# Patient Record
Sex: Female | Born: 1990 | Race: White | Hispanic: No | Marital: Single | Smoking: Current some day smoker
Health system: Southern US, Community
[De-identification: ages and names within clinical notes are randomized; demographics above are authoritative.]

## PROBLEM LIST (undated history)

## (undated) DIAGNOSIS — K8020 Calculus of gallbladder without cholecystitis without obstruction: Secondary | ICD-10-CM

## (undated) HISTORY — PX: EYE SURGERY: SHX253

---

## 2012-10-18 ENCOUNTER — Encounter (HOSPITAL_COMMUNITY): Payer: Self-pay | Admitting: *Deleted

## 2012-10-18 ENCOUNTER — Emergency Department (HOSPITAL_COMMUNITY): Payer: Self-pay

## 2012-10-18 ENCOUNTER — Emergency Department (HOSPITAL_COMMUNITY)
Admission: EM | Admit: 2012-10-18 | Discharge: 2012-10-18 | Disposition: A | Discharge status: Home / Self Care | Payer: Self-pay | Attending: Emergency Medicine | Admitting: Emergency Medicine

## 2012-10-18 DIAGNOSIS — Z79899 Other long term (current) drug therapy: Secondary | ICD-10-CM | POA: Insufficient documentation

## 2012-10-18 DIAGNOSIS — N946 Dysmenorrhea, unspecified: Secondary | ICD-10-CM | POA: Insufficient documentation

## 2012-10-18 DIAGNOSIS — F17200 Nicotine dependence, unspecified, uncomplicated: Secondary | ICD-10-CM | POA: Insufficient documentation

## 2012-10-18 LAB — BASIC METABOLIC PANEL
CO2: 24 mEq/L (ref 19–32)
Calcium: 9 mg/dL (ref 8.4–10.5)
GFR calc non Af Amer: >90 mL/min (ref >90)
Potassium: 3.6 mEq/L (ref 3.5–5.1)
Sodium: 135 mEq/L (ref 135–145)

## 2012-10-18 LAB — URINALYSIS, ROUTINE W REFLEX MICROSCOPIC
Bilirubin Urine: NEGATIVE
Leukocytes, UA: NEGATIVE
Nitrite: NEGATIVE
Specific Gravity, Urine: 1.013 (ref 1.005–1.030)
Urobilinogen, UA: 0.2 mg/dL (ref 0.0–1.0)
pH: 6.5 (ref 5.0–8.0)

## 2012-10-18 LAB — CBC WITH DIFFERENTIAL/PLATELET
Basophils Absolute: 0 10*3/uL (ref 0.0–0.1)
Eosinophils Absolute: 0.2 10*3/uL (ref 0.0–0.7)
Eosinophils Relative: 2 % (ref 0–5)
Lymphocytes Relative: 24 % (ref 12–46)
Lymphs Abs: 2.5 10*3/uL (ref 0.7–4.0)
MCV: 86.5 fL (ref 78.0–100.0)
Neutrophils Relative %: 66 % (ref 43–77)
Platelets: 281 10*3/uL (ref 150–400)
RBC: 4.29 MIL/uL (ref 3.87–5.11)
RDW: 12.5 % (ref 11.5–15.5)
WBC: 10.5 10*3/uL (ref 4.0–10.5)

## 2012-10-18 LAB — WET PREP, GENITAL
Clue Cells Wet Prep HPF POC: NONE SEEN
Trich, Wet Prep: NONE SEEN
Yeast Wet Prep HPF POC: NONE SEEN

## 2012-10-18 LAB — URINE MICROSCOPIC-ADD ON

## 2012-10-18 MED ORDER — FENTANYL CITRATE 0.05 MG/ML IJ SOLN
50.0000 ug | Freq: Once | INTRAMUSCULAR | Status: AC
  Administered 2012-10-18: 50 ug via INTRAVENOUS
  Filled 2012-10-18: qty 2

## 2012-10-18 MED ORDER — HYDROMORPHONE HCL PF 1 MG/ML IJ SOLN
1.0000 mg | Freq: Once | INTRAMUSCULAR | Status: AC
  Administered 2012-10-18: 1 mg via INTRAVENOUS
  Filled 2012-10-18: qty 1

## 2012-10-18 MED ORDER — HYDROCODONE-ACETAMINOPHEN 5-500 MG PO TABS: 1.0000 | ORAL_TABLET | Freq: Four times a day (QID) | ORAL | Status: DC | PRN

## 2012-10-18 MED ORDER — ONDANSETRON HCL 4 MG PO TABS: 4.0000 mg | ORAL_TABLET | Freq: Four times a day (QID) | ORAL | Status: DC

## 2012-10-18 MED ORDER — ONDANSETRON HCL 4 MG/2ML IJ SOLN
4.0000 mg | Freq: Once | INTRAMUSCULAR | Status: AC
  Administered 2012-10-18: 4 mg via INTRAVENOUS
  Filled 2012-10-18: qty 2

## 2012-10-18 MED ORDER — SODIUM CHLORIDE 0.9 % IV BOLUS (SEPSIS)
1000.0000 mL | Freq: Once | INTRAVENOUS | Status: AC
  Administered 2012-10-18: 1000 mL via INTRAVENOUS

## 2012-10-18 NOTE — Progress Notes (Signed)
[facility] ED CM noted no pcp nor coverage CM spoke with pt and female family at bedside who states pt recently (less than 6 months) moved to [place]. Pt states she will be obtaining insurance coverage soon through her job at [employer] and discussed with pt how to get an in network pcp (to prevent extra fees for using out of network provider) Pt voiced understanding and appreciation of information provided

## 2012-10-18 NOTE — ED Provider Notes (Signed)
History     CSN: [id number]  Arrival date & time 10/18/12  1257   First MD Initiated Contact with Patient 10/18/12 1311      No chief complaint on file.   (Consider location/radiation/quality/duration/timing/severity/associated sxs/prior treatment) HPI  Pt to the ED with  Complaints of vaginal bleeding and uterine cramping. She admits that this is her second period this month and that her periods each month have been getting progressively worse. She admits that she has come to the hospital before for the same many years ago and they told her "nothing was wrong". She denies having any pre-existing disease. She is not passing any clots, she has not been vomiting or having diarrhea. Denies abnormal vaginal discharge. vss but in severe pain   History reviewed. No pertinent past medical history.  History reviewed. No pertinent past surgical history.  History reviewed. No pertinent family history.  History  Substance Use Topics  . Smoking status: Current Every Day Smoker  . Smokeless tobacco: Never Used  . Alcohol Use: Yes    OB History    Grav Para Term Preterm Abortions TAB SAB Ect Mult Living                  Review of Systems   Review of Systems  Gen: no weight loss, fevers, chills, night sweats  Eyes: no discharge or drainage, no occular pain or visual changes  Nose: no epistaxis or rhinorrhea  Mouth: no dental pain, no sore throat  Neck: no neck pain  Lungs:No wheezing, coughing or hemoptysis CV: no chest pain, palpitations, dependent edema or orthopnea  Abd: no abdominal pain, nausea, vomiting  GU: no dysuria or gross hematuria, + vaginal bleeding and suprapubic pain MSK:  No abnormalities  Neuro: no headache, no focal neurologic deficits  Skin: no abnormalities Psyche: negative.    Allergies  Review of patient's allergies indicates no known allergies.  Home Medications   Current Outpatient Rx  Name Route Sig Dispense Refill  . IBUPROFEN 200 MG PO  TABS Oral Take 200 mg by mouth every 6 (six) hours as needed. PAIN      BP 132/77  Pulse 69  Temp 97.6 F (36.4 C) (Oral)  Resp 18  SpO2 100%  LMP 10/18/2012  Physical Exam  Nursing note and vitals reviewed. Constitutional: She appears well-developed and well-nourished. No distress.  HENT:  Head: Normocephalic and atraumatic.  Eyes: Pupils are equal, round, and reactive to light.  Neck: Normal range of motion. Neck supple.  Cardiovascular: Normal rate and regular rhythm.   Pulmonary/Chest: Effort normal.  Abdominal: Soft.  Genitourinary: Uterus is tender. Uterus is not deviated, not enlarged and not fixed. Right adnexum displays tenderness. Right adnexum displays no mass and no fullness. Left adnexum displays tenderness. Left adnexum displays no mass and no fullness. There is tenderness and bleeding around the vagina. No erythema around the vagina. No foreign body around the vagina. No signs of injury around the vagina. No vaginal discharge found.  Neurological: She is alert.  Skin: Skin is warm and dry.    ED Course  Procedures (including critical care time)  Labs Reviewed  URINALYSIS, ROUTINE W REFLEX MICROSCOPIC - Abnormal; Notable for the following:    Hgb urine dipstick SMALL (*)     All other components within normal limits  WET PREP, GENITAL - Abnormal; Notable for the following:    WBC, Wet Prep HPF POC FEW (*)     All other components within normal limits  PREGNANCY, URINE  CBC WITH DIFFERENTIAL  BASIC METABOLIC PANEL  URINE MICROSCOPIC-ADD ON  GC/CHLAMYDIA PROBE AMP, GENITAL   US Transvaginal Non-ob  10/18/2012  *RADIOLOGY REPORT*  Clinical Data:  Pelvic pain.  Rule out torsion.  TRANSABDOMINAL AND TRANSVAGINAL ULTRASOUND OF PELVIS DOPPLER ULTRASOUND OF OVARIES  Technique:  Both transabdominal and transvaginal ultrasound examinations of the pelvis were performed. Transabdominal technique was performed for global imaging of the pelvis including uterus, ovaries,  adnexal regions, and pelvic cul-de-sac.  It was necessary to proceed with endovaginal exam following the transabdominal exam to visualize the ovaries.  Color and duplex Doppler ultrasound was utilized to evaluate blood flow to the ovaries.  Comparison:  None.  Findings:  Uterus:  7.64-0.9 x 4.0 cm.  Myometrium is within normal limits.  Endometrium:  Normal in thickness and appearance  Right ovary: Normal appearance/no adnexal mass  Left ovary:   Normal appearance/no adnexal mass  Pulsed Doppler evaluation demonstrates normal-appearing low resistance arterial wave form and both ovaries.  Venous wave forms are present in both ovaries.  Trace amount of free fluid is present likely physiologic.  IMPRESSION: Within normal limits.  Trace free fluid is likely physiologic.  No sonographic evidence for ovarian torsion.   Original Report Authenticated By: Donavan Burnet, M.D.    US Pelvis Complete  10/18/2012  *RADIOLOGY REPORT*  Clinical Data:  Pelvic pain.  Rule out torsion.  TRANSABDOMINAL AND TRANSVAGINAL ULTRASOUND OF PELVIS DOPPLER ULTRASOUND OF OVARIES  Technique:  Both transabdominal and transvaginal ultrasound examinations of the pelvis were performed. Transabdominal technique was performed for global imaging of the pelvis including uterus, ovaries, adnexal regions, and pelvic cul-de-sac.  It was necessary to proceed with endovaginal exam following the transabdominal exam to visualize the ovaries.  Color and duplex Doppler ultrasound was utilized to evaluate blood flow to the ovaries.  Comparison:  None.  Findings:  Uterus:  7.64-0.9 x 4.0 cm.  Myometrium is within normal limits.  Endometrium:  Normal in thickness and appearance  Right ovary: Normal appearance/no adnexal mass  Left ovary:   Normal appearance/no adnexal mass  Pulsed Doppler evaluation demonstrates normal-appearing low resistance arterial wave form and both ovaries.  Venous wave forms are present in both ovaries.  Trace amount of free fluid is  present likely physiologic.  IMPRESSION: Within normal limits.  Trace free fluid is likely physiologic.  No sonographic evidence for ovarian torsion.   Original Report Authenticated By: Donavan Burnet, M.D.    [place] Art/ven Flow Abd Pelv Doppler  10/18/2012  *RADIOLOGY REPORT*  Clinical Data:  Pelvic pain.  Rule out torsion.  TRANSABDOMINAL AND TRANSVAGINAL ULTRASOUND OF PELVIS DOPPLER ULTRASOUND OF OVARIES  Technique:  Both transabdominal and transvaginal ultrasound examinations of the pelvis were performed. Transabdominal technique was performed for global imaging of the pelvis including uterus, ovaries, adnexal regions, and pelvic cul-de-sac.  It was necessary to proceed with endovaginal exam following the transabdominal exam to visualize the ovaries.  Color and duplex Doppler ultrasound was utilized to evaluate blood flow to the ovaries.  Comparison:  None.  Findings:  Uterus:  7.64-0.9 x 4.0 cm.  Myometrium is within normal limits.  Endometrium:  Normal in thickness and appearance  Right ovary: Normal appearance/no adnexal mass  Left ovary:   Normal appearance/no adnexal mass  Pulsed Doppler evaluation demonstrates normal-appearing low resistance arterial wave form and both ovaries.  Venous wave forms are present in both ovaries.  Trace amount of free fluid is present likely physiologic.  IMPRESSION: Within  normal limits.  Trace free fluid is likely physiologic.  No sonographic evidence for ovarian torsion.   Original Report Authenticated By: Donavan Burnet, M.D.      1. Dysmenorrhea       MDM  Work-up negative for anemia or signs of infections. [place] negative for PID, ovarian torsion or any other abnormalities.  Pt has been advised of the symptoms that warrant their return to the ED. Patient has voiced understanding and has agreed to follow-up with the PCP or specialist.         Dorthula Matas, PA 10/18/12 1626

## 2012-10-18 NOTE — ED Notes (Signed)
Pt states she's having her menstrual cycle for the 2nd time this month, states started today, having heavy bleeding, denies clots, states having nausea/vomiting and dizziness with it also. Pt states having severe abdominal pain.

## 2012-10-18 NOTE — ED Notes (Signed)
Pt c/o of increased pain. MD notified.

## 2012-10-19 NOTE — ED Provider Notes (Signed)
Medical screening examination/treatment/procedure(s) were performed by non-physician practitioner and as supervising physician I was immediately available for consultation/collaboration.   Loren Racer, MD 10/19/12 [phone]

## 2013-01-01 ENCOUNTER — Emergency Department (HOSPITAL_COMMUNITY): Payer: BC Managed Care – PPO

## 2013-01-01 ENCOUNTER — Encounter (HOSPITAL_COMMUNITY): Payer: Self-pay | Admitting: *Deleted

## 2013-01-01 ENCOUNTER — Emergency Department (INDEPENDENT_AMBULATORY_CARE_PROVIDER_SITE_OTHER)
Admission: EM | Admit: 2013-01-01 | Discharge: 2013-01-01 | Disposition: A | Discharge status: Nursing Home (Includes ICF) | Payer: BC Managed Care – PPO | Source: Home / Self Care

## 2013-01-01 ENCOUNTER — Encounter (HOSPITAL_COMMUNITY): Payer: Self-pay | Admitting: Emergency Medicine

## 2013-01-01 ENCOUNTER — Emergency Department (HOSPITAL_COMMUNITY)
Admission: EM | Admit: 2013-01-01 | Discharge: 2013-01-02 | Disposition: A | Discharge status: Home / Self Care | Payer: BC Managed Care – PPO | Attending: Emergency Medicine | Admitting: Emergency Medicine

## 2013-01-01 DIAGNOSIS — R42 Dizziness and giddiness: Secondary | ICD-10-CM

## 2013-01-01 DIAGNOSIS — F17200 Nicotine dependence, unspecified, uncomplicated: Secondary | ICD-10-CM | POA: Insufficient documentation

## 2013-01-01 DIAGNOSIS — B9789 Other viral agents as the cause of diseases classified elsewhere: Secondary | ICD-10-CM | POA: Insufficient documentation

## 2013-01-01 DIAGNOSIS — H539 Unspecified visual disturbance: Secondary | ICD-10-CM

## 2013-01-01 DIAGNOSIS — H8302 Labyrinthitis, left ear: Secondary | ICD-10-CM

## 2013-01-01 DIAGNOSIS — B349 Viral infection, unspecified: Secondary | ICD-10-CM

## 2013-01-01 DIAGNOSIS — G119 Hereditary ataxia, unspecified: Secondary | ICD-10-CM

## 2013-01-01 DIAGNOSIS — H8309 Labyrinthitis, unspecified ear: Secondary | ICD-10-CM | POA: Insufficient documentation

## 2013-01-01 LAB — BASIC METABOLIC PANEL
BUN: 14 mg/dL (ref 6–23)
CO2: 29 mEq/L (ref 19–32)
Chloride: 101 mEq/L (ref 96–112)
GFR calc Af Amer: >90 mL/min (ref >90)
Glucose, Bld: 97 mg/dL (ref 70–99)
Potassium: 4.6 mEq/L (ref 3.5–5.1)

## 2013-01-01 LAB — CBC WITH DIFFERENTIAL/PLATELET
HCT: 38.6 % (ref 36.0–46.0)
Hemoglobin: 13 g/dL (ref 12.0–15.0)
Lymphocytes Relative: 28 % (ref 12–46)
Lymphs Abs: 2.9 10*3/uL (ref 0.7–4.0)
Monocytes Absolute: 0.8 10*3/uL (ref 0.1–1.0)
Monocytes Relative: 8 % (ref 3–12)
Neutro Abs: 6.2 10*3/uL (ref 1.7–7.7)
Neutrophils Relative %: 61 % (ref 43–77)
RBC: 4.4 MIL/uL (ref 3.87–5.11)

## 2013-01-01 LAB — GLUCOSE, CAPILLARY: Glucose-Capillary: 94 mg/dL (ref 70–99)

## 2013-01-01 MED ORDER — MECLIZINE HCL 25 MG PO TABS
25.0000 mg | ORAL_TABLET | Freq: Once | ORAL | Status: AC
  Administered 2013-01-02: 25 mg via ORAL
  Filled 2013-01-01: qty 1

## 2013-01-01 MED ORDER — IBUPROFEN 800 MG PO TABS
800.0000 mg | ORAL_TABLET | Freq: Once | ORAL | Status: AC
  Administered 2013-01-02: 800 mg via ORAL
  Filled 2013-01-01: qty 1

## 2013-01-01 NOTE — ED Notes (Signed)
The pts husband is furious.  He wants to eat and he wants his wife to eat.  Policy  Quoted.  Very unhappy

## 2013-01-01 NOTE — ED Provider Notes (Signed)
History     CSN: [id number]  Arrival date & time 01/01/13  1703   First MD Initiated Contact with Patient 01/01/13 2322      Chief Complaint  Patient presents with  . Neck Pain  . Blurred Vision    (Consider location/radiation/quality/duration/timing/severity/associated sxs/prior treatment) HPISuzanna Holland is a 22 y.o. female who presents with a constellation of complaints. Patient was a transfer from urgent care. Patient complains about neck pain and some what she describes as "after images." She says that when objects move in front of her face she says she sees trails and after images of that and is associated then with a sensation that the world is moving her vertigo and nausea with disorientation. She says the symptoms are severe and constant throughout the day.  On Sunday, patient went to [employer] to eat and noticed a rash on her face while in the bathroom, this resolved and there is no other symptoms. Monday, the patient was fine. Tuesday, the patient awoke with some left-sided trapezius pain it is worse when she turns her head or shortness her shoulders. This feels like a muscular or aching pain, it is moderate, she has not taken anything for it.  During the morning she developed the problems with her vision,  Nausea and vertigo.  Patient is also noted on Monday that she had some here pain, sense of fullness, and some decreased hearing in the left ear. She denies any tinnitus. No fevers or chills.  Patient denies any chest pain, shortness of breath, productive cough, abdominal pain, nausea vomiting or diarrhea, no weakness, numbness or tingling, no problems walking.   History reviewed. No pertinent past medical history.  Past Surgical History  Procedure Date  . Eye surgery     No family history on file.  History  Substance Use Topics  . Smoking status: Current Every Day Smoker -- 0.2 packs/day    Types: Cigarettes  . Smokeless tobacco: Never Used  . Alcohol Use: Yes      OB History    Grav Para Term Preterm Abortions TAB SAB Ect Mult Living                  Review of Systems At least 10pt or greater review of systems completed and are negative except where specified in the HPI.  Allergies  Other  Home Medications   Current Outpatient Rx  Name  Route  Sig  Dispense  Refill  . IBUPROFEN 200 MG PO TABS   Oral   Take 200 mg by mouth every 6 (six) hours as needed. PAIN           BP 119/62  Pulse 64  Temp 97.9 F (36.6 C) (Oral)  Resp 16  SpO2 100%  LMP 12/17/2012  Physical Exam  PHYSICAL EXAM: VITAL SIGNS:  . Filed Vitals:   01/02/13 0019 01/02/13 0021 01/02/13 0145 01/02/13 0200  BP: 105/56 117/61  110/65  Pulse: 81 84 80 82  Temp:      TempSrc:      Resp:    16  SpO2:   97% 97%   CONSTITUTIONAL: Awake, oriented, appears non-toxic HENT: Atraumatic, normocephalic, oral mucosa pink and moist, airway patent. Nares patent without drainage. External ears normal, mild fluid effusion on the left, right appears normal. EYES: Conjunctiva clear, EOMI, PERRLA NECK: Trachea midline, non-tender, supple CARDIOVASCULAR: Normal heart rate, Normal rhythm, No murmurs, rubs, gallops PULMONARY/CHEST: Clear to auscultation, no rhonchi, wheezes, or rales. Symmetrical breath sounds.  CHEST WALL: No lesions. Non-tender. ABDOMINAL: Non-distended, soft, non-tender - no rebound or guarding.  BS normal. NEUROLOGIC: AV:WUJWJX fields intact. PERRLA, EOMI.  Facial sensation equal to light touch bilaterally.  Good muscle bulk in the masseter muscle and good lateral movement of the jaw.  Facial expressions equal and good strength with smile/frown and puffed cheeks.  Hearing grossly intact to finger rub test.  Uvula, tongue are midline with no deviation. Symmetrical palate elevation.  Trapezius and SCM muscles are 5/5 strength bilaterally.   DTR: Brachioradialis, biceps, patellar, Achilles tendon reflexes 2+ bilaterally.  No clonus. Strength: 5/5 strength  flexors and extensors in the upper and lower extremities.  Grip strength, finger adduction/abduction 5/5. Sensation: Sensation intact distally to light touch Cerebellar: No ataxia with walking or dysmetria with finger to nose, rapid alternating hand movements and heels to shin testing. Patient is able to walk unassisted. EXTREMITIES: No clubbing, cyanosis, or edema SKIN: Warm, Dry, No erythema, No rash   ED Course  Procedures (including critical care time)   Labs Reviewed  CBC WITH DIFFERENTIAL  BASIC METABOLIC PANEL   Ct Head Wo Contrast  01/01/2013  *RADIOLOGY REPORT*  Clinical Data: 22 year old female with headache, blurred vision and ataxia.  CT HEAD WITHOUT CONTRAST  Technique:  Contiguous axial images were obtained from the base of the skull through the vertex without contrast.  Comparison: None  Findings: No intracranial abnormalities are identified, including mass lesion or mass effect, hydrocephalus, extra-axial fluid collection, midline shift, hemorrhage, or acute infarction.  The visualized bony calvarium is unremarkable. The visualized paranasal sinuses and mastoid air cells are clear.  IMPRESSION: Unremarkable noncontrast head CT.   Original Report Authenticated By: Harmon Pier, M.D.      1. Vertigo   2. Labyrinthitis of left ear   3. Viral syndrome       MDM  Kelli Holland is a 22 y.o. female referred to the emergency department by urgent care for concerns of "cerebellar ataxia." Patient is not ataxic, she has no cerebellar symptoms at this time. Patient does have signs and symptoms consistent with vertigo or labyrinthitis. This is a peripheral cause I do not think it is central.  Patient did have a CT in the emergency department which was negative. Also labs including a BMP and CBC are within normal limits.  Patient was given some meclizine, had some sleep, and then felt much better. Patient was able to get up and walk without an ataxic gait.  The patient's symptoms are  most likely related to peripheral vertigo secondary to viral infection of the labyrinth, indicated by the patient's temporary decreased hearing in the left ear, mild left ear effusion.  No evidence for intracranial emergency at this time or infection of the central nervous system. Vital signs are stable and within normal limits, patient is nontoxic and afebrile.  She is looking to followup with primary care provider - she can find one of her choosing in followup.  I explained the diagnosis and have given explicit precautions to return to the ER including any other new or worsening symptoms. The patient understands and accepts the medical plan as it's been dictated and I have answered their questions. Discharge instructions concerning home care and prescriptions have been given.  The patient is STABLE and is discharged to home in good condition.          Jones Skene, MD 01/02/13 [phone]

## 2013-01-01 NOTE — ED Notes (Signed)
Pt and pt husband states that on Sunday pt had allergic reaction that caused her to have dizziness, pins and needles, lose hearing in left ear, blurry vision, itching. Symptoms subsided. Pt states "Yesterday, I kicked butt at work and today my symptoms came back." Pt c/o dizzy, neck pain, and neuro deficits noted at [facility]. Pt husband states "She failed a standard DUI test and finger tracking test and she is losing tracks of time." Pt having loss of memory per pt husband.

## 2013-01-01 NOTE — ED Provider Notes (Signed)
History     CSN: [id number]  Arrival date & time 01/01/13  1444   First MD Initiated Contact with Patient 01/01/13 1601      Chief Complaint  Patient presents with  . Torticollis    (Consider location/radiation/quality/duration/timing/severity/associated sxs/prior treatment) HPI Comments: 22 year old female presents with neck pain that started this morning. She is also complaining of dizziness, "vision jumping" and nausea. She denies fever, chills or other constitutional symptoms. Denies URI symptoms, GI/GU symptoms. She speaks softly, quietly and has difficulty in explaining her symptoms.   History reviewed. No pertinent past medical history.  History reviewed. No pertinent past surgical history.  No family history on file.  History  Substance Use Topics  . Smoking status: Current Every Day Smoker -- 0.2 packs/day    Types: Cigarettes  . Smokeless tobacco: Never Used  . Alcohol Use: Yes    OB History    Grav Para Term Preterm Abortions TAB SAB Ect Mult Living                  Review of Systems  Constitutional: Positive for activity change. Negative for fever.  HENT: Positive for neck pain. Negative for congestion, sore throat, rhinorrhea, neck stiffness, postnasal drip and ear discharge.        Positive for blurred vision and double vision.  Cardiovascular: Negative.   Gastrointestinal: Positive for nausea. Negative for vomiting.  Genitourinary: Negative.   Skin: Negative.   Neurological: Positive for dizziness and light-headedness. Negative for tremors, syncope, facial asymmetry, weakness and headaches.    Allergies  Review of patient's allergies indicates no known allergies.  Home Medications   Current Outpatient Rx  Name  Route  Sig  Dispense  Refill  . HYDROCODONE-ACETAMINOPHEN 5-500 MG PO TABS   Oral   Take 1-2 tablets by mouth every 6 (six) hours as needed for pain.   15 tablet   0   . IBUPROFEN 200 MG PO TABS   Oral   Take 200 mg by mouth every  6 (six) hours as needed. PAIN         . ONDANSETRON HCL 4 MG PO TABS   Oral   Take 1 tablet (4 mg total) by mouth every 6 (six) hours.   12 tablet   0     BP 118/61  Pulse 73  Temp 98.3 F (36.8 C) (Oral)  Resp 16  SpO2 100%  LMP 12/17/2012  Physical Exam  Constitutional: She appears well-developed and well-nourished. No distress.  HENT:  Mouth/Throat: Oropharynx is clear and moist. No oropharyngeal exudate.       Bilateral TMs are normal  Eyes: Pupils are equal, round, and reactive to light.  Neck:       Limited range of motion of the neck due to pain in the left para cervical musculature. She is able to rotate her head approximately 20, limited due to pain. Right rotation is 45, forward flexion is complete.  Cardiovascular: Normal rate, regular rhythm and normal heart sounds.   Pulmonary/Chest: Effort normal and breath sounds normal. No respiratory distress. She has no wheezes.  Lymphadenopathy:    She has no cervical adenopathy.  Neurological: She is alert. A cranial nerve deficit is present.       She has difficulty in completing cranial nerve tests. Several attempts to establish EOMIs causes her to shut her eyes and stopped the test. She is unable to complete finger to nose with pass pointing and "chasing" the testing digit . Positive  Romberg. And inability to perform heel to toe without falling out of line.  Skin: Skin is warm and dry.    ED Course  Procedures (including critical care time)   Labs Reviewed  GLUCOSE, CAPILLARY   No results found.   1. Dizziness   2. Visual disturbances   3. Cerebellar ataxia       MDM   22 year old female with acute changes in neurologic activity. She is having dizziness, visual disturbances with diplopia,blurred vision and nausea. She exhibits signs of cerebellar ataxia and  Poor coordination. Transfer to the emergency department for further evaluation.        Hayden Rasmussen, NP 01/01/13 1643  Hayden Rasmussen,  NP 01/01/13 1649

## 2013-01-01 NOTE — ED Notes (Addendum)
Pt c/o painful neck and stiffness since this am Reports a poss allergic reaction to food she had on Sunday.  Sx included: disorientation, hives, left ear numbness along w/decreased hearing, SOB, chest tightness, vomiting Took Sudafed w/some relief.  Denies: fevers, diarrhea, inj/trauma to site  She is alert w/no signs of acute distress.

## 2013-01-01 NOTE — ED Notes (Signed)
Pt is transfer from urgent care.  Pt has had neck pain since this am.  Pt is complaining of issues with eyes, blurred vision.  Pt states changes started Sunday afternoon and feels like things are moving around.   Sent down here for symptoms of cerebellar ataxia.

## 2013-01-01 NOTE — ED Notes (Signed)
While tracking this rns finger, pt blinked her eyes multiple times. Pt states "Its just that i see everything on both sides and it really bothers me." Pt eyes do track this RNs finger. No ataxia noted by this rn.

## 2013-01-01 NOTE — ED Provider Notes (Signed)
Medical screening examination/treatment/procedure(s) were performed by resident physician or non-physician practitioner and as supervising physician I was immediately available for consultation/collaboration.   Harace Mccluney DOUGLAS MD.    Maigan Bittinger D Leno Mathes, MD 01/01/13 1754 

## 2013-01-02 MED ORDER — MECLIZINE HCL 25 MG PO TABS: 25.0000 mg | ORAL_TABLET | Freq: Three times a day (TID) | ORAL | Status: DC | PRN

## 2013-01-02 NOTE — ED Notes (Signed)
Pt discharged.Vital signs stable and GCS 15 

## 2013-01-02 NOTE — ED Notes (Signed)
Meals given.

## 2013-01-02 NOTE — ED Notes (Signed)
Pt ambulated to the bathroom without assistance. 

## 2013-04-12 ENCOUNTER — Encounter (HOSPITAL_COMMUNITY): Payer: Self-pay | Admitting: Emergency Medicine

## 2013-04-12 ENCOUNTER — Telehealth (HOSPITAL_COMMUNITY): Payer: Self-pay | Admitting: *Deleted

## 2013-04-12 ENCOUNTER — Emergency Department (HOSPITAL_COMMUNITY)
Admission: EM | Admit: 2013-04-12 | Discharge: 2013-04-12 | Disposition: A | Discharge status: Home / Self Care | Payer: BC Managed Care – PPO | Attending: Emergency Medicine | Admitting: Emergency Medicine

## 2013-04-12 DIAGNOSIS — M722 Plantar fascial fibromatosis: Secondary | ICD-10-CM | POA: Insufficient documentation

## 2013-04-12 MED ORDER — HYDROCODONE-ACETAMINOPHEN 5-325 MG PO TABS
2.0000 | ORAL_TABLET | Freq: Once | ORAL | Status: AC
  Administered 2013-04-12: 2 via ORAL
  Filled 2013-04-12: qty 2

## 2013-04-12 MED ORDER — IBUPROFEN 800 MG PO TABS: 800.0000 mg | ORAL_TABLET | Freq: Three times a day (TID) | ORAL | Status: DC

## 2013-04-12 NOTE — ED Provider Notes (Signed)
Medical screening examination/treatment/procedure(s) were performed by non-physician practitioner and as supervising physician I was immediately available for consultation/collaboration.   Lyanne Co, MD 04/12/13 [phone]

## 2013-04-12 NOTE — ED Provider Notes (Signed)
History     CSN: [id number]  Arrival date & time 04/12/13  0605   First MD Initiated Contact with Patient 04/12/13 [phone]      Chief Complaint  Patient presents with  . Foot Pain    (Consider location/radiation/quality/duration/timing/severity/associated sxs/prior treatment) HPI Comments: Patient presents to the ED with a chief complaint of right foot pain.  Patient states that the pain has been worsening since Monday.  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  She states that the pain is making it difficult to walk.  She states that the pain is moderate.  It does not radiate.  She denies any injuries to the foot.  Denies fever, chills, nausea or vomiting.  She has tried taking some ibuprofen, which did not help.  The history is provided by the patient. No language interpreter was used.    History reviewed. No pertinent past medical history.  Past Surgical History  Procedure Laterality Date  . Eye surgery      Family History  Problem Relation Age of Onset  . Hypertension Father     History  Substance Use Topics  . Smoking status: Former Smoker -- 0.25 packs/day    Types: Cigarettes  . Smokeless tobacco: Never Used  . Alcohol Use: Yes     Comment: rare    OB History   Grav Para Term Preterm Abortions TAB SAB Ect Mult Living                  Review of Systems  All other systems reviewed and are negative.    Allergies  Other  Home Medications   Current Outpatient Rx  Name  Route  Sig  Dispense  Refill  . ibuprofen (ADVIL,MOTRIN) 200 MG tablet   Oral   Take 200 mg by mouth every 6 (six) hours as needed. PAIN         . meclizine (ANTIVERT) 25 MG tablet   Oral   Take 1 tablet (25 mg total) by mouth 3 (three) times daily as needed for dizziness or nausea.   30 tablet   0     BP 116/65  Pulse 70  Temp(Src) 97.6 F (36.4 C) (Oral)  Resp 16  SpO2 100%  LMP 02/24/2013  Physical Exam  Nursing note and vitals  reviewed. Constitutional: She is oriented to person, place, and time. She appears well-developed and well-nourished.  HENT:  Head: Normocephalic and atraumatic.  Eyes: Conjunctivae and EOM are normal.  Neck: Normal range of motion.  Cardiovascular: Normal rate and intact distal pulses.   Brisk capillary refill  Pulmonary/Chest: Effort normal.  Abdominal: She exhibits no distension.  Musculoskeletal: Normal range of motion. She exhibits tenderness.  Right ankle and foot ROM and strength is 5/5, tenderness to palpation of the arch of the foot, no bony tenderness, or gross abnormality or deformity  Neurological: She is alert and oriented to person, place, and time.  Skin: Skin is dry.  No redness, increased warmth, or signs of infection  Psychiatric: She has a normal mood and affect. Her behavior is normal. Judgment and thought content normal.    ED Course  Procedures (including critical care time)  Labs Reviewed - No data to display No results found.   1. Plantar fasciitis of right foot       MDM  Patient with right foot pain.  No bony tenderness or MOI. No imaging required per [facility] rules. Symptoms and presentation are characteristic of  plantar fasciitis.   Recommend that the patient taking ibuprofen for the next week.  Orthotics for arch support.  Rest over the weekend.  Also discussed ice massage and referral to podiatry/ortho foot/ankle.  Patient understands and agrees with the plan.  She is stable and ready for discharge.        Roxy Horseman, PA-C 04/12/13 [phone]

## 2013-04-12 NOTE — ED Notes (Signed)
Pt states she is having right foot pain that started Monday night and has gotten worse since then  Pt denies injury

## 2013-05-05 ENCOUNTER — Emergency Department (HOSPITAL_COMMUNITY)
Admission: EM | Admit: 2013-05-05 | Discharge: 2013-05-05 | Disposition: A | Discharge status: Home / Self Care | Payer: BC Managed Care – PPO | Attending: Emergency Medicine | Admitting: Emergency Medicine

## 2013-05-05 ENCOUNTER — Encounter (HOSPITAL_COMMUNITY): Payer: Self-pay | Admitting: Cardiology

## 2013-05-05 DIAGNOSIS — R509 Fever, unspecified: Secondary | ICD-10-CM | POA: Insufficient documentation

## 2013-05-05 DIAGNOSIS — R112 Nausea with vomiting, unspecified: Secondary | ICD-10-CM | POA: Insufficient documentation

## 2013-05-05 DIAGNOSIS — R5381 Other malaise: Secondary | ICD-10-CM | POA: Insufficient documentation

## 2013-05-05 DIAGNOSIS — Z3202 Encounter for pregnancy test, result negative: Secondary | ICD-10-CM | POA: Insufficient documentation

## 2013-05-05 DIAGNOSIS — F17200 Nicotine dependence, unspecified, uncomplicated: Secondary | ICD-10-CM | POA: Insufficient documentation

## 2013-05-05 DIAGNOSIS — Z792 Long term (current) use of antibiotics: Secondary | ICD-10-CM | POA: Insufficient documentation

## 2013-05-05 DIAGNOSIS — R109 Unspecified abdominal pain: Secondary | ICD-10-CM | POA: Insufficient documentation

## 2013-05-05 DIAGNOSIS — R197 Diarrhea, unspecified: Secondary | ICD-10-CM | POA: Insufficient documentation

## 2013-05-05 LAB — COMPREHENSIVE METABOLIC PANEL
ALT: 17 U/L (ref 0–35)
AST: 12 U/L (ref 0–37)
Albumin: 3.6 g/dL (ref 3.5–5.2)
Alkaline Phosphatase: 61 U/L (ref 39–117)
BUN: 12 mg/dL (ref 6–23)
CO2: 29 mEq/L (ref 19–32)
Calcium: 9.7 mg/dL (ref 8.4–10.5)
Chloride: 102 mEq/L (ref 96–112)
Creatinine, Ser: 0.75 mg/dL (ref 0.50–1.10)
GFR calc Af Amer: >90 mL/min (ref >90)
GFR calc non Af Amer: >90 mL/min (ref >90)
Glucose, Bld: 102 mg/dL — ABNORMAL HIGH (ref 70–99)
Potassium: 4.2 mEq/L (ref 3.5–5.1)
Sodium: 140 mEq/L (ref 135–145)
Total Bilirubin: 0.2 mg/dL — ABNORMAL LOW (ref 0.3–1.2)
Total Protein: 7.2 g/dL (ref 6.0–8.3)

## 2013-05-05 LAB — CBC WITH DIFFERENTIAL/PLATELET
Basophils Absolute: 0 10*3/uL (ref 0.0–0.1)
Basophils Relative: 0 % (ref 0–1)
Eosinophils Absolute: 0.2 10*3/uL (ref 0.0–0.7)
Eosinophils Relative: 2 % (ref 0–5)
HCT: 38.2 % (ref 36.0–46.0)
Hemoglobin: 12.9 g/dL (ref 12.0–15.0)
Lymphocytes Relative: 26 % (ref 12–46)
Lymphs Abs: 2.9 10*3/uL (ref 0.7–4.0)
MCH: 28.8 pg (ref 26.0–34.0)
MCHC: 33.8 g/dL (ref 30.0–36.0)
MCV: 85.3 fL (ref 78.0–100.0)
Monocytes Absolute: 0.7 10*3/uL (ref 0.1–1.0)
Monocytes Relative: 6 % (ref 3–12)
Neutro Abs: 7.1 10*3/uL (ref 1.7–7.7)
Neutrophils Relative %: 65 % (ref 43–77)
Platelets: 273 10*3/uL (ref 150–400)
RBC: 4.48 MIL/uL (ref 3.87–5.11)
RDW: 12.2 % (ref 11.5–15.5)
WBC: 10.9 10*3/uL — ABNORMAL HIGH (ref 4.0–10.5)

## 2013-05-05 LAB — URINALYSIS, ROUTINE W REFLEX MICROSCOPIC
Bilirubin Urine: NEGATIVE
Glucose, UA: NEGATIVE mg/dL
Hgb urine dipstick: NEGATIVE
Ketones, ur: NEGATIVE mg/dL
Leukocytes, UA: NEGATIVE
Nitrite: NEGATIVE
Protein, ur: NEGATIVE mg/dL
Specific Gravity, Urine: 1.007 (ref 1.005–1.030)
Urobilinogen, UA: 0.2 mg/dL (ref 0.0–1.0)
pH: 6 (ref 5.0–8.0)

## 2013-05-05 LAB — LIPASE, BLOOD: Lipase: 47 U/L (ref 11–59)

## 2013-05-05 LAB — POCT PREGNANCY, URINE: Preg Test, Ur: NEGATIVE

## 2013-05-05 LAB — WET PREP, GENITAL: Yeast Wet Prep HPF POC: NONE SEEN

## 2013-05-05 MED ORDER — KETOROLAC TROMETHAMINE 15 MG/ML IJ SOLN
15.0000 mg | Freq: Once | INTRAMUSCULAR | Status: AC
  Administered 2013-05-05: 15 mg via INTRAVENOUS
  Filled 2013-05-05: qty 1

## 2013-05-05 MED ORDER — SODIUM CHLORIDE 0.9 % IV BOLUS (SEPSIS)
1000.0000 mL | Freq: Once | INTRAVENOUS | Status: AC
  Administered 2013-05-05: 1000 mL via INTRAVENOUS

## 2013-05-05 MED ORDER — ONDANSETRON HCL 4 MG/2ML IJ SOLN
4.0000 mg | Freq: Once | INTRAMUSCULAR | Status: AC
  Administered 2013-05-05: 4 mg via INTRAVENOUS
  Filled 2013-05-05: qty 2

## 2013-05-05 NOTE — ED Notes (Signed)
Pelvic cart at bedside. 

## 2013-05-05 NOTE — ED Provider Notes (Signed)
History     CSN: [id number]  Arrival date & time 05/05/13  1759   First MD Initiated Contact with Patient 05/05/13 1826      Chief Complaint  Patient presents with  . Abdominal Pain  . Fatigue     Patient is a 22 y.o. female presenting with abdominal pain.  Abdominal Pain Pain location:  LLQ, RLQ and suprapubic Pain quality: aching   Pain radiates to:  Does not radiate Pain severity:  Moderate Onset quality:  Gradual Duration:  5 days Timing:  Intermittent Progression:  Unchanged Chronicity:  New Context: not previous surgeries and not trauma   Relieved by:  Nothing Exacerbated by: nothing. Ineffective treatments:  NSAIDs Associated symptoms: diarrhea, fever and nausea   Associated symptoms: no anorexia, no chest pain, no chills, no constipation, no cough, no dysuria, no hematuria, no shortness of breath, no sore throat, no vaginal bleeding, no vaginal discharge and no vomiting   Diarrhea:    Quality:  Semi-solid   Number of occurrences:  About 5 times per day for several days; however, diarrhea has improved with immodium, and she has had no further diarrhea today   Severity:  Moderate   Duration:  5 days Fever:    Timing:  Intermittent (about 3 days)   Max temp PTA (F):  102   Progression since onset: her last fever was early this morning, several hours ago. Risk factors: no alcohol abuse, has not had multiple surgeries, not pregnant and no recent hospitalization   She has also been taking amoxicillin for presumed sinusitis throughout her course of illness; however, the diarrhea started before she started taking the antibiotic and has improved rather than worsened in the interim.  History reviewed. No pertinent past medical history.  Past Surgical History  Procedure Laterality Date  . Eye surgery      Family History  Problem Relation Age of Onset  . Hypertension Father     History  Substance Use Topics  . Smoking status: Current Some Day Smoker -- 0.25  packs/day    Types: Cigarettes  . Smokeless tobacco: Never Used  . Alcohol Use: Yes     Comment: rare    OB History   Grav Para Term Preterm Abortions TAB SAB Ect Mult Living                  Review of Systems  Constitutional: Positive for fever. Negative for chills and diaphoresis.  HENT: Negative for congestion, sore throat, rhinorrhea, neck pain and neck stiffness.   Respiratory: Negative for cough, shortness of breath and wheezing.   Cardiovascular: Negative for chest pain and leg swelling.  Gastrointestinal: Positive for nausea, abdominal pain and diarrhea. Negative for vomiting, constipation and anorexia.  Genitourinary: Negative for dysuria, urgency, frequency, hematuria, flank pain, vaginal bleeding, vaginal discharge and difficulty urinating.  Skin: Negative for rash.  Neurological: Negative for weakness, numbness and headaches.  All other systems reviewed and are negative.    Allergies  Other  Home Medications   Current Outpatient Rx  Name  Route  Sig  Dispense  Refill  . amoxicillin (AMOXIL) 875 MG tablet   Oral   Take 875 mg by mouth 2 (two) times daily. Started taking medication on Thursday 05-02-13 for 10 days         . ibuprofen (ADVIL,MOTRIN) 200 MG tablet   Oral   Take 800 mg by mouth every 8 (eight) hours as needed for pain or headache. PAIN         .  Norgestimate-Ethinyl Estradiol Triphasic (ORTHO TRI-CYCLEN, 28,) 0.18/0.215/0.25 MG-35 MCG tablet   Oral   Take 1 tablet by mouth every evening.           BP 116/68  Pulse 67  Temp(Src) 98 F (36.7 C) (Oral)  Resp 18  SpO2 99%  LMP 02/24/2013  Physical Exam  Nursing note and vitals reviewed. Constitutional: She is oriented to person, place, and time. She appears well-developed and well-nourished. No distress.  HENT:  Head: Normocephalic and atraumatic.  Mouth/Throat: Oropharynx is clear and moist.  Eyes: Conjunctivae and EOM are normal. Pupils are equal, round, and reactive to light. No  scleral icterus.  Neck: Normal range of motion. Neck supple. No JVD present.  Cardiovascular: Normal rate, regular rhythm, normal heart sounds and intact distal pulses.  Exam reveals no gallop and no friction rub.   No murmur heard. Pulmonary/Chest: Effort normal and breath sounds normal. No respiratory distress. She has no wheezes. She has no rales.  Abdominal: Soft. Normal appearance and bowel sounds are normal. She exhibits no distension and no mass. There is no hepatosplenomegaly. There is tenderness in the right lower quadrant and left lower quadrant. There is no rigidity, no rebound, no guarding, no CVA tenderness, no tenderness at McBurney's point and negative Murphy's sign. No hernia.  Genitourinary: Vagina normal. There is no lesion on the right labia. There is no lesion on the left labia. Cervix exhibits no motion tenderness, no discharge and no friability. Right adnexum displays no mass, no tenderness and no fullness. Left adnexum displays no mass, no tenderness and no fullness. No erythema, tenderness or bleeding around the vagina. No vaginal discharge found.  Musculoskeletal: She exhibits no edema.  Neurological: She is alert and oriented to person, place, and time. No cranial nerve deficit. She exhibits normal muscle tone. Coordination normal.  Skin: Skin is warm and dry. She is not diaphoretic.    ED Course  Procedures (including critical care time)  Labs Reviewed  WET PREP, GENITAL - Abnormal; Notable for the following:    WBC, Wet Prep HPF POC MODERATE (*)    All other components within normal limits  CBC WITH DIFFERENTIAL - Abnormal; Notable for the following:    WBC 10.9 (*)    All other components within normal limits  COMPREHENSIVE METABOLIC PANEL - Abnormal; Notable for the following:    Glucose, Bld 102 (*)    Total Bilirubin 0.2 (*)    All other components within normal limits  GC/CHLAMYDIA PROBE AMP  LIPASE, BLOOD  URINALYSIS, ROUTINE W REFLEX MICROSCOPIC  POCT  PREGNANCY, URINE   No results found.   1. Abdominal pain   2. Diarrhea   3. Nausea and vomiting       MDM  22 yo F, being treated for sinusitis, also with diarrhea, cramping lower abdominal pain, and fever.  Symptoms started about 1 week ago.  Diarrhea started before antibiotics.  Fever for 2-3 days.  No dysuria, VB, vaginal discharge, flank pain, or focal abdominal pain. On exam, she is afebrile, well appearing, in NAD, HENT exam unremarkable, neck supple with no meningismus, chest CTAB, abd soft, ND, normal bowel sounds, with minimal TTP throughout lower abdomen, L and R equally.  No peritonitis.  Unremarkable GU exam.  Labs reveal mild leukocytosis, normal CMP and lipase, normal urine studies.  Pain well controlled here with dose of toradol.  Likely gastroenteritis given diarrhea and febrile illness with abd cramps.  I doubt appendicitis, obstruction, volvulus, or other surgical GI  emergency.  Doubt ovarian torsion.  Doubt kidney stone.  Discussed with patient that it is not possible to r/o surgical process such as appendicitis without CT scan, but that given my very low suspicion, I do not feel the benefits of CT outweigh the risks at this time.  However, we discussed the need to return immediately for worsening pain, intractable nausea/vomiting, inability to tolerate PO intake, or any other worrisome symptoms.  Discussed expected course.  She is to followup with her PCP in 1-2 days for reevaluation, return here if needed as discussed.          Toney Sang, MD 05/06/13 [phone]

## 2013-05-05 NOTE — ED Notes (Signed)
Pt reports since Tuesday she has been having generalized fatigue and fever. States that she went to [place] and dx with stomach virus. States that she developed lower abd pain, but denies any urinary symptoms. Reports that she fevers has been up and down with no real relief.

## 2013-05-06 LAB — GC/CHLAMYDIA PROBE AMP
CT Probe RNA: NEGATIVE
GC Probe RNA: NEGATIVE

## 2013-05-08 NOTE — ED Provider Notes (Signed)
I saw and evaluated the patient, reviewed the resident's note and I agree with the findings and plan.  22yF with abdominal pain and n/v. Suspect viral illness. Mild lower abdominal tenderness w/o peritonitis. Low suspicion for emergent process. Plan symptomatic tx.   Raeford Razor, MD 05/08/13 [phone]

## 2013-05-10 ENCOUNTER — Emergency Department (HOSPITAL_COMMUNITY): Payer: BC Managed Care – PPO

## 2013-05-10 ENCOUNTER — Emergency Department (HOSPITAL_COMMUNITY)
Admission: EM | Admit: 2013-05-10 | Discharge: 2013-05-10 | Disposition: A | Discharge status: Home / Self Care | Payer: BC Managed Care – PPO | Attending: Emergency Medicine | Admitting: Emergency Medicine

## 2013-05-10 ENCOUNTER — Encounter (HOSPITAL_COMMUNITY): Payer: Self-pay | Admitting: *Deleted

## 2013-05-10 DIAGNOSIS — R109 Unspecified abdominal pain: Secondary | ICD-10-CM | POA: Insufficient documentation

## 2013-05-10 DIAGNOSIS — R110 Nausea: Secondary | ICD-10-CM | POA: Insufficient documentation

## 2013-05-10 DIAGNOSIS — R300 Dysuria: Secondary | ICD-10-CM | POA: Insufficient documentation

## 2013-05-10 DIAGNOSIS — R509 Fever, unspecified: Secondary | ICD-10-CM | POA: Insufficient documentation

## 2013-05-10 DIAGNOSIS — Z79899 Other long term (current) drug therapy: Secondary | ICD-10-CM | POA: Insufficient documentation

## 2013-05-10 DIAGNOSIS — F17200 Nicotine dependence, unspecified, uncomplicated: Secondary | ICD-10-CM | POA: Insufficient documentation

## 2013-05-10 DIAGNOSIS — Z3202 Encounter for pregnancy test, result negative: Secondary | ICD-10-CM | POA: Insufficient documentation

## 2013-05-10 LAB — CBC WITH DIFFERENTIAL/PLATELET
Basophils Absolute: 0 10*3/uL (ref 0.0–0.1)
Basophils Relative: 0 % (ref 0–1)
Eosinophils Absolute: 0.2 10*3/uL (ref 0.0–0.7)
Eosinophils Relative: 2 % (ref 0–5)
HCT: 36.4 % (ref 36.0–46.0)
Hemoglobin: 12.6 g/dL (ref 12.0–15.0)
MCH: 29.3 pg (ref 26.0–34.0)
MCHC: 34.6 g/dL (ref 30.0–36.0)
Monocytes Absolute: 0.8 10*3/uL (ref 0.1–1.0)
Monocytes Relative: 7 % (ref 3–12)
Neutro Abs: 6.7 10*3/uL (ref 1.7–7.7)
RDW: 12.2 % (ref 11.5–15.5)

## 2013-05-10 LAB — COMPREHENSIVE METABOLIC PANEL
AST: 19 U/L (ref 0–37)
Albumin: 3.5 g/dL (ref 3.5–5.2)
BUN: 9 mg/dL (ref 6–23)
Calcium: 9.3 mg/dL (ref 8.4–10.5)
Chloride: 101 mEq/L (ref 96–112)
Creatinine, Ser: 0.61 mg/dL (ref 0.50–1.10)
Total Bilirubin: 0.3 mg/dL (ref 0.3–1.2)
Total Protein: 7.2 g/dL (ref 6.0–8.3)

## 2013-05-10 LAB — URINALYSIS, ROUTINE W REFLEX MICROSCOPIC
Glucose, UA: NEGATIVE mg/dL
Hgb urine dipstick: NEGATIVE
Leukocytes, UA: NEGATIVE
Protein, ur: NEGATIVE mg/dL
Specific Gravity, Urine: 1.009 (ref 1.005–1.030)
pH: 6 (ref 5.0–8.0)

## 2013-05-10 LAB — WET PREP, GENITAL: Trich, Wet Prep: NONE SEEN

## 2013-05-10 MED ORDER — IOHEXOL 300 MG/ML  SOLN
50.0000 mL | Freq: Once | INTRAMUSCULAR | Status: AC | PRN
  Administered 2013-05-10: 50 mL via ORAL

## 2013-05-10 MED ORDER — MORPHINE SULFATE 4 MG/ML IJ SOLN
4.0000 mg | Freq: Once | INTRAMUSCULAR | Status: AC
  Administered 2013-05-10: 4 mg via INTRAVENOUS
  Filled 2013-05-10: qty 1

## 2013-05-10 MED ORDER — SODIUM CHLORIDE 0.9 % IV BOLUS (SEPSIS)
1000.0000 mL | Freq: Once | INTRAVENOUS | Status: AC
  Administered 2013-05-10: 1000 mL via INTRAVENOUS

## 2013-05-10 MED ORDER — ONDANSETRON HCL 4 MG/2ML IJ SOLN
4.0000 mg | Freq: Once | INTRAMUSCULAR | Status: AC
  Administered 2013-05-10: 4 mg via INTRAVENOUS
  Filled 2013-05-10: qty 2

## 2013-05-10 MED ORDER — IOHEXOL 300 MG/ML  SOLN
100.0000 mL | Freq: Once | INTRAMUSCULAR | Status: AC | PRN
  Administered 2013-05-10: 100 mL via INTRAVENOUS

## 2013-05-10 MED ORDER — OXYCODONE-ACETAMINOPHEN 5-325 MG PO TABS: 1.0000 | ORAL_TABLET | ORAL | Status: DC | PRN

## 2013-05-10 MED ORDER — DICYCLOMINE HCL 10 MG PO CAPS
10.0000 mg | ORAL_CAPSULE | Freq: Once | ORAL | Status: AC
  Administered 2013-05-10: 10 mg via ORAL
  Filled 2013-05-10: qty 1

## 2013-05-10 MED ORDER — OXYCODONE-ACETAMINOPHEN 5-325 MG PO TABS
1.0000 | ORAL_TABLET | Freq: Once | ORAL | Status: AC
  Administered 2013-05-10: 1 via ORAL
  Filled 2013-05-10: qty 1

## 2013-05-10 NOTE — ED Notes (Signed)
Pt presents to ed with c/o lower abdominal pain; sts was seen at [facility] ED on Sunday and was told to come back if pain gets worse. Pt also sts she is constipated and also reports burning with urination.

## 2013-05-10 NOTE — ED Provider Notes (Signed)
Complains of low abdominal pain for approximately 2 weeks. Pain is worse with eating. Last bowel movement yesterday, diarrhea. No blood per rectum. Maximum temperature 100.4. No vomiting. Admits to diminished appetite on exam alert nontoxic abdomen obese tender at lower quadrants bilaterally no guarding rigidity or rebound  Doug Sou, MD 05/10/13 2102

## 2013-05-10 NOTE — ED Provider Notes (Signed)
History     CSN: [id number]  Arrival date & time 05/10/13  4098   First MD Initiated Contact with Patient 05/10/13 1828      No chief complaint on file.   (Consider location/radiation/quality/duration/timing/severity/associated sxs/prior treatment) HPI Comments: Patient presents with a chief complaint of lower abdominal pain.  She reports that the pain has been present almost 2 weeks and is gradually worsening.  She describes the pain as a "sharp, burning" pain.   Pain located across her entire lower abdomen.  She has not taken anything for the pain prior to arrival.   She reports that the pain has been associated with some diarrhea and nausea, but no vomiting.  No blood in her stool.  She reports that her last episode of diarrhea was yesterday.  She also reports that she did have a fever earlier in the week, but no fever today.  She reports a temperature of 100.4 yesterday.  She is also describing some dysuria, but no increase in urinary frequency or urgency.  She denies vaginal discharge.  LMP was approximately 2 weeks ago.    The history is provided by the patient.    History reviewed. No pertinent past medical history.  Past Surgical History  Procedure Laterality Date  . Eye surgery      Family History  Problem Relation Age of Onset  . Hypertension Father     History  Substance Use Topics  . Smoking status: Current Some Day Smoker -- 0.25 packs/day    Types: Cigarettes  . Smokeless tobacco: Never Used  . Alcohol Use: Yes     Comment: rare    OB History   Grav Para Term Preterm Abortions TAB SAB Ect Mult Living                  Review of Systems  Constitutional: Positive for fever and chills.  Gastrointestinal: Positive for nausea, abdominal pain and diarrhea. Negative for vomiting, constipation, blood in stool and abdominal distention.  Genitourinary: Positive for dysuria.  All other systems reviewed and are negative.    Allergies  Other  Home Medications    Current Outpatient Rx  Name  Route  Sig  Dispense  Refill  . amoxicillin (AMOXIL) 875 MG tablet   Oral   Take 875 mg by mouth 2 (two) times daily. Started taking medication on Thursday 05-02-13 for 10 days         . ibuprofen (ADVIL,MOTRIN) 200 MG tablet   Oral   Take 800 mg by mouth every 8 (eight) hours as needed for pain or headache. PAIN         . Norgestimate-Ethinyl Estradiol Triphasic (ORTHO TRI-CYCLEN, 28,) 0.18/0.215/0.25 MG-35 MCG tablet   Oral   Take 1 tablet by mouth every evening.           BP 130/67  Pulse 86  Temp(Src) 97.4 F (36.3 C) (Oral)  Resp 16  SpO2 100%  LMP 02/24/2013  Physical Exam  Nursing note and vitals reviewed. Constitutional: She appears well-developed and well-nourished. No distress.  HENT:  Head: Normocephalic and atraumatic.  Mouth/Throat: Oropharynx is clear and moist.  Neck: Normal range of motion. Neck supple.  Cardiovascular: Normal rate, regular rhythm and normal heart sounds.   Pulmonary/Chest: Effort normal and breath sounds normal.  Abdominal: Soft. Normal appearance and bowel sounds are normal. She exhibits no distension and no mass. There is generalized tenderness. There is no rigidity, no rebound, no guarding, no tenderness at McBurney's point and  negative Murphy's sign.  Obese  Genitourinary: Cervix exhibits no motion tenderness. Right adnexum displays tenderness. Right adnexum displays no mass and no fullness. Left adnexum displays tenderness. Left adnexum displays no mass and no fullness.  Neurological: She is alert.  Skin: Skin is warm and dry. She is not diaphoretic.  Psychiatric: She has a normal mood and affect.    ED Course  Procedures (including critical care time)  Labs Reviewed  GC/CHLAMYDIA PROBE AMP  WET PREP, GENITAL  CBC WITH DIFFERENTIAL  COMPREHENSIVE METABOLIC PANEL  URINALYSIS, ROUTINE W REFLEX MICROSCOPIC   No results found.   No diagnosis found.  8:07 PM Patient signed out to [employer], PA-C.  Pelvic ultrasound pending.  MDM  Patient presenting with lower abdominal pain that has been present for the past 2 weeks.  Labs unremarkable.  Patient with adnexal tenderness bilaterally on exam.  Pelvic ultrasound pending.  Katie Schinlever, PA-C will follow up on results.          Pascal Lux [place], PA-C 05/11/13 2350

## 2013-05-10 NOTE — ED Provider Notes (Signed)
Pt received from [place], [place].  Healthy 22yo F presented to ED w/ c/o diffuse lower abd pain x 2 weeks, aggravated by eating.  Has been unable to eat/drink recently and is consequently generally weak.  Transvaginal US as well as CT abd/pelvis are unremarkable.  Results discussed w/ pt and her boyfriend.  Pt reports that she is in severe pain constantly.  On re-examination, afebrile, uncomfortable appearing, abd soft/non-distended, diffuse lower abd ttp.  Pt to receive one percocet in ED and d/c'd home w/ same.  Referred to GI and advised to f/u asap.  Return precautions discussed. 11:07 PM   Otilio Miu, PA-C 05/10/13 [phone]

## 2013-05-11 LAB — GC/CHLAMYDIA PROBE AMP
CT Probe RNA: NEGATIVE
GC Probe RNA: NEGATIVE

## 2013-05-11 NOTE — ED Provider Notes (Signed)
Medical screening examination/treatment/procedure(s) were conducted as a shared visit with non-physician practitioner(s) and myself.  I personally evaluated the patient during the encounter  Doug Sou, MD 05/11/13 2352

## 2013-05-11 NOTE — ED Provider Notes (Signed)
Medical screening examination/treatment/procedure(s) were conducted as a shared visit with non-physician practitioner(s) and myself.  I personally evaluated the patient during the encounter  Doug Sou, MD 05/11/13 [phone]

## 2013-07-04 ENCOUNTER — Encounter (HOSPITAL_COMMUNITY): Payer: Self-pay

## 2013-07-04 ENCOUNTER — Emergency Department (HOSPITAL_COMMUNITY)
Admission: EM | Admit: 2013-07-04 | Discharge: 2013-07-04 | Disposition: A | Discharge status: Home / Self Care | Payer: BC Managed Care – PPO | Attending: Emergency Medicine | Admitting: Emergency Medicine

## 2013-07-04 DIAGNOSIS — Z79899 Other long term (current) drug therapy: Secondary | ICD-10-CM | POA: Insufficient documentation

## 2013-07-04 DIAGNOSIS — R220 Localized swelling, mass and lump, head: Secondary | ICD-10-CM | POA: Insufficient documentation

## 2013-07-04 DIAGNOSIS — F17200 Nicotine dependence, unspecified, uncomplicated: Secondary | ICD-10-CM | POA: Insufficient documentation

## 2013-07-04 DIAGNOSIS — T498X5A Adverse effect of other topical agents, initial encounter: Secondary | ICD-10-CM | POA: Insufficient documentation

## 2013-07-04 DIAGNOSIS — R110 Nausea: Secondary | ICD-10-CM | POA: Insufficient documentation

## 2013-07-04 DIAGNOSIS — T7840XA Allergy, unspecified, initial encounter: Secondary | ICD-10-CM

## 2013-07-04 DIAGNOSIS — R51 Headache: Secondary | ICD-10-CM | POA: Insufficient documentation

## 2013-07-04 DIAGNOSIS — R221 Localized swelling, mass and lump, neck: Secondary | ICD-10-CM | POA: Insufficient documentation

## 2013-07-04 LAB — CBC
HCT: 40.9 % (ref 36.0–46.0)
Hemoglobin: 13.6 g/dL (ref 12.0–15.0)
RBC: 4.76 MIL/uL (ref 3.87–5.11)
WBC: 11.9 10*3/uL — ABNORMAL HIGH (ref 4.0–10.5)

## 2013-07-04 LAB — BASIC METABOLIC PANEL
CO2: 25 mEq/L (ref 19–32)
Chloride: 102 mEq/L (ref 96–112)
GFR calc non Af Amer: >90 mL/min (ref >90)
Glucose, Bld: 112 mg/dL — ABNORMAL HIGH (ref 70–99)
Potassium: 3.8 mEq/L (ref 3.5–5.1)
Sodium: 139 mEq/L (ref 135–145)

## 2013-07-04 MED ORDER — PREDNISONE 20 MG PO TABS: 40.0000 mg | ORAL_TABLET | Freq: Every day | ORAL | Status: DC

## 2013-07-04 MED ORDER — ONDANSETRON HCL 4 MG/2ML IJ SOLN
4.0000 mg | Freq: Four times a day (QID) | INTRAMUSCULAR | Status: DC | PRN
  Administered 2013-07-04: 4 mg via INTRAVENOUS
  Filled 2013-07-04: qty 2

## 2013-07-04 MED ORDER — FAMOTIDINE IN NACL 20-0.9 MG/50ML-% IV SOLN
20.0000 mg | Freq: Two times a day (BID) | INTRAVENOUS | Status: DC
  Filled 2013-07-04: qty 50

## 2013-07-04 MED ORDER — PREDNISONE 20 MG PO TABS
40.0000 mg | ORAL_TABLET | Freq: Once | ORAL | Status: AC
  Administered 2013-07-04: 40 mg via ORAL
  Filled 2013-07-04: qty 2

## 2013-07-04 MED ORDER — DIPHENHYDRAMINE HCL 50 MG/ML IJ SOLN
12.5000 mg | Freq: Once | INTRAMUSCULAR | Status: AC
  Administered 2013-07-04: 12.5 mg via INTRAVENOUS
  Filled 2013-07-04: qty 1

## 2013-07-04 NOTE — ED Notes (Signed)
Pt states she now has a headache and some nausea. PA aware. Family at bedside.

## 2013-07-04 NOTE — ED Notes (Signed)
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

## 2013-07-04 NOTE — ED Provider Notes (Signed)
History    CSN: [id number] Arrival date & time 07/04/13  1924  First MD Initiated Contact with Patient 07/04/13 2003     Chief Complaint  Patient presents with  . Allergic Reaction   (Consider location/radiation/quality/duration/timing/severity/associated sxs/prior Treatment) HPI Comments: Patient presents to the emergency department with chief complaint of allergic reaction. She states that she is allergic to horse rash sauce, and that she might have eaten some for dinner tonight. She states that she has become "spacey." She also states that she has had some tongue swelling. She states that she feels very tired. She denies any other symptoms at this time. She denies difficulty breathing. She has not taken anything to alleviate her symptoms. Her symptoms have not worsened while in the emergency department.  The history is provided by the patient. No language interpreter was used.   History reviewed. No pertinent past medical history. Past Surgical History  Procedure Laterality Date  . Eye surgery     Family History  Problem Relation Age of Onset  . Hypertension Father    History  Substance Use Topics  . Smoking status: Current Some Day Smoker -- 0.25 packs/day    Types: Cigarettes  . Smokeless tobacco: Never Used  . Alcohol Use: Yes     Comment: rare   OB History   Grav Para Term Preterm Abortions TAB SAB Ect Mult Living                 Review of Systems  All other systems reviewed and are negative.    Allergies  Other  Home Medications   Current Outpatient Rx  Name  Route  Sig  Dispense  Refill  . ibuprofen (ADVIL,MOTRIN) 200 MG tablet   Oral   Take 200 mg by mouth every 6 (six) hours as needed for pain.         . Norgestimate-Ethinyl Estradiol Triphasic (ORTHO TRI-CYCLEN, 28,) 0.18/0.215/0.25 MG-35 MCG tablet   Oral   Take 1 tablet by mouth every evening.         Marland Kitchen acetaminophen (TYLENOL) 500 MG tablet   Oral   Take 500 mg by mouth every 6 (six)  hours as needed for pain.         Marland Kitchen amoxicillin (AMOXIL) 875 MG tablet   Oral   Take 875 mg by mouth 2 (two) times daily. Started taking medication on Thursday 05-02-13 for 10 days         . ibuprofen (ADVIL,MOTRIN) 200 MG tablet   Oral   Take 800 mg by mouth every 8 (eight) hours as needed for pain or headache. PAIN          BP 142/87  Pulse 97  Temp(Src) 98.5 F (36.9 C) (Oral)  Resp 20  Ht 5\' 3"  (1.6 m)  Wt 240 lb (108.863 kg)  BMI 42.52 kg/m2  SpO2 98%  LMP 07/02/2013 Physical Exam  Nursing note and vitals reviewed. Constitutional: She is oriented to person, place, and time. She appears well-developed and well-nourished.  HENT:  Head: Normocephalic and atraumatic.  No tongue swelling, no signs of Ludwig angina, airway is intact  Eyes: Conjunctivae and EOM are normal. Pupils are equal, round, and reactive to light.  Neck: Normal range of motion. Neck supple.  Cardiovascular: Normal rate and regular rhythm.  Exam reveals no gallop and no friction rub.   No murmur heard. Pulmonary/Chest: Effort normal and breath sounds normal. No respiratory distress. She has no wheezes. She has no rales. She  exhibits no tenderness.  No difficulty breathing  Abdominal: Soft. Bowel sounds are normal. She exhibits no distension and no mass. There is no tenderness. There is no rebound and no guarding.  Musculoskeletal: Normal range of motion. She exhibits no edema and no tenderness.  Neurological: She is alert and oriented to person, place, and time.  Skin: Skin is warm and dry.  Psychiatric: She has a normal mood and affect. Her behavior is normal. Judgment and thought content normal.    ED Course  Procedures (including critical care time) Labs Reviewed  CBC - Abnormal; Notable for the following:    WBC 11.9 (*)    All other components within normal limits  BASIC METABOLIC PANEL   Results for orders placed during the hospital encounter of 07/04/13  CBC      Result Value Range    WBC 11.9 (*) 4.0 - 10.5 K/uL   RBC 4.76  3.87 - 5.11 MIL/uL   Hemoglobin 13.6  12.0 - 15.0 g/dL   HCT 16.1  09.6 - 04.5 %   MCV 85.9  78.0 - 100.0 fL   MCH 28.6  26.0 - 34.0 pg   MCHC 33.3  30.0 - 36.0 g/dL   RDW 40.9  81.1 - 91.4 %   Platelets 346  150 - 400 K/uL  BASIC METABOLIC PANEL      Result Value Range   Sodium 139  135 - 145 mEq/L   Potassium 3.8  3.5 - 5.1 mEq/L   Chloride 102  96 - 112 mEq/L   CO2 25  19 - 32 mEq/L   Glucose, Bld 112 (*) 70 - 99 mg/dL   BUN 8  6 - 23 mg/dL   Creatinine, Ser 7.82  0.50 - 1.10 mg/dL   Calcium 9.8  8.4 - 95.6 mg/dL   GFR calc non Af Amer >90  >90 mL/min   GFR calc Af Amer >90  >90 mL/min   No results found.   1. Allergic reaction, initial encounter     MDM  Patient with allergic reaction, treated with Benadryl and Pepcid. Will monitor for 1 hour, and anticipate discharge.  9:43 PM Filed Vitals:   07/04/13 2138  BP: 114/58  Pulse: 67  Temp:   Resp: 16    Patient states that she has a slight headache, but states that she will take some Tylenol or ibuprofen when she gets home. She is stable and ready for discharge. She is neurovascularly intact. There is no airway obstruction. She is breathing appropriately. She is stable and ready for discharge.  Roxy Horseman, PA-C 07/04/13 2143

## 2013-07-07 NOTE — ED Provider Notes (Signed)
Medical screening examination/treatment/procedure(s) were performed by non-physician practitioner and as supervising physician I was immediately available for consultation/collaboration.   Alynah Schone L Rafferty [place], MD 07/07/13 1110 

## 2014-04-02 ENCOUNTER — Emergency Department (HOSPITAL_COMMUNITY)
Admission: EM | Admit: 2014-04-02 | Discharge: 2014-04-02 | Disposition: A | Discharge status: Home / Self Care | Payer: BC Managed Care – PPO | Attending: Emergency Medicine | Admitting: Emergency Medicine

## 2014-04-02 ENCOUNTER — Emergency Department (HOSPITAL_COMMUNITY): Payer: BC Managed Care – PPO

## 2014-04-02 ENCOUNTER — Encounter (HOSPITAL_COMMUNITY): Payer: Self-pay | Admitting: Emergency Medicine

## 2014-04-02 DIAGNOSIS — M545 Low back pain, unspecified: Secondary | ICD-10-CM | POA: Insufficient documentation

## 2014-04-02 DIAGNOSIS — E669 Obesity, unspecified: Secondary | ICD-10-CM | POA: Insufficient documentation

## 2014-04-02 DIAGNOSIS — F17200 Nicotine dependence, unspecified, uncomplicated: Secondary | ICD-10-CM | POA: Insufficient documentation

## 2014-04-02 DIAGNOSIS — R209 Unspecified disturbances of skin sensation: Secondary | ICD-10-CM | POA: Insufficient documentation

## 2014-04-02 DIAGNOSIS — R52 Pain, unspecified: Secondary | ICD-10-CM | POA: Insufficient documentation

## 2014-04-02 DIAGNOSIS — Z791 Long term (current) use of non-steroidal anti-inflammatories (NSAID): Secondary | ICD-10-CM | POA: Insufficient documentation

## 2014-04-02 DIAGNOSIS — M25559 Pain in unspecified hip: Secondary | ICD-10-CM | POA: Insufficient documentation

## 2014-04-02 DIAGNOSIS — M549 Dorsalgia, unspecified: Secondary | ICD-10-CM

## 2014-04-02 MED ORDER — CYCLOBENZAPRINE HCL 10 MG PO TABS: 10.0000 mg | ORAL_TABLET | Freq: Three times a day (TID) | ORAL | Status: DC | PRN

## 2014-04-02 MED ORDER — KETOROLAC TROMETHAMINE 60 MG/2ML IM SOLN
60.0000 mg | Freq: Once | INTRAMUSCULAR | Status: AC
  Administered 2014-04-02: 60 mg via INTRAMUSCULAR
  Filled 2014-04-02: qty 2

## 2014-04-02 MED ORDER — DIAZEPAM 5 MG/ML IJ SOLN
5.0000 mg | Freq: Once | INTRAMUSCULAR | Status: AC
  Administered 2014-04-02: 5 mg via INTRAMUSCULAR
  Filled 2014-04-02: qty 2

## 2014-04-02 MED ORDER — DEXAMETHASONE SODIUM PHOSPHATE 10 MG/ML IJ SOLN
10.0000 mg | Freq: Once | INTRAMUSCULAR | Status: AC
  Administered 2014-04-02: 10 mg via INTRAVENOUS
  Filled 2014-04-02: qty 1

## 2014-04-02 MED ORDER — NAPROXEN 500 MG PO TABS: 500.0000 mg | ORAL_TABLET | Freq: Two times a day (BID) | ORAL | Status: DC

## 2014-04-02 NOTE — ED Notes (Signed)
She c/o non-traumatic low back pain radiating into "both my hips".  She is in no distress.  She also mentions that she underwent I.U.D. Insertion yesterday.  She states she has been having this back pain for about two weeks.

## 2014-04-02 NOTE — Discharge Instructions (Signed)
Try ice and heat to your back. Both will help. Take the medications as prescribed. Recheck with your doctor if not improving or see the orthopedic back specialist. Do the back exercises once your back pain is gone to strengthen your back.

## 2014-04-02 NOTE — ED Provider Notes (Signed)
CSN: [id number]     Arrival date & time 04/02/14  5366 History   First MD Initiated Contact with Patient 04/02/14 [phone]     Chief Complaint  Patient presents with  . Back Pain     (Consider location/radiation/quality/duration/timing/severity/associated sxs/prior Treatment) HPI Patient reports she had some lower back pain earlier in March however it stopped on its own without treatment. It was gone for about 2 weeks. She states it restarted again about 9 days ago. She states she woke up with sleep with pain. Her husband states she does violent turning in her sleep where she throws herself over. She denies any known injury, falls, change in her activity. She states it started with a burning pain in her left hip but then went into her lower back and now is also into her right hip. She states any type of movement makes the pain worse. She cannot stand to wear clothing that is around her waist because of increased pain. She also states she's at the point where she is unable to walk. She has pins and needles in both of her legs. She does not have incontinence.  Patient did have an IUD placed yesterday at [facility].  PCP Dr Sherlyn Lick  History reviewed. No pertinent past medical history. Past Surgical History  Procedure Laterality Date  . Eye surgery     Family History  Problem Relation Age of Onset  . Hypertension Father    History  Substance Use Topics  . Smoking status: Current Some Day Smoker -- 0.25 packs/day    Types: Cigarettes  . Smokeless tobacco: Never Used  . Alcohol Use: Yes     Comment: rare  smokes 5 cigs a day Lives at home  Lives with spouse student  OB History   Grav Para Term Preterm Abortions TAB SAB Ect Mult Living                 Review of Systems  All other systems reviewed and are negative.     Allergies  Other  Home Medications   Current Outpatient Rx  Name  Route  Sig  Dispense  Refill  . levonorgestrel (MIRENA) 20 MCG/24HR IUD  Intrauterine   1 each by Intrauterine route once.         . cyclobenzaprine (FLEXERIL) 10 MG tablet   Oral   Take 1 tablet (10 mg total) by mouth 3 (three) times daily as needed (muscle pain).   30 tablet   0   . naproxen (NAPROSYN) 500 MG tablet   Oral   Take 1 tablet (500 mg total) by mouth 2 (two) times daily with a meal.   30 tablet   0    Placed [facility] at [facility]  BP 117/58  Pulse 90  Temp(Src) 98.7 F (37.1 C) (Oral)  Resp 22  SpO2 100%  LMP 03/22/2014  Vital signs normal   Physical Exam  Nursing note and vitals reviewed. Constitutional: She is oriented to person, place, and time. She appears well-developed and well-nourished.  Non-toxic appearance. She does not appear ill. No distress.  obese  HENT:  Head: Normocephalic and atraumatic.  Right Ear: External ear normal.  Left Ear: External ear normal.  Nose: Nose normal. No mucosal edema or rhinorrhea.  Mouth/Throat: Oropharynx is clear and moist and mucous membranes are normal. No dental abscesses or uvula swelling.  Eyes: Conjunctivae and EOM are normal. Pupils are equal, round, and reactive to light.  Neck: Normal range of motion and  full passive range of motion without pain. Neck supple.  Cardiovascular: Normal rate, regular rhythm and normal heart sounds.  Exam reveals no gallop and no friction rub.   No murmur heard. Pulmonary/Chest: Effort normal and breath sounds normal. No respiratory distress. She has no wheezes. She has no rhonchi. She has no rales. She exhibits no tenderness and no crepitus.  Abdominal: Soft. Normal appearance and bowel sounds are normal. She exhibits no distension. There is no tenderness. There is no rebound and no guarding.  Musculoskeletal: Normal range of motion. She exhibits no edema and no tenderness.       Back:  When patient flexes her right knee and hip it causes pain in her mid lower back. She has some mild tenderness over the true hip joint on the right. When  she flexes her left knee and hip it causes pain in her lower back. Her hip is nontender to palpation. On exam of her spine she only has tenderness in the lower lumbar/sacral area. She does not have SI joint tenderness to palpation. She does not have pain to palpation in the sciatic notch on either buttock.  Neurological: She is alert and oriented to person, place, and time. She has normal strength. No cranial nerve deficit.  Skin: Skin is warm, dry and intact. No rash noted. No erythema. No pallor.  Psychiatric: She has a normal mood and affect. Her speech is normal and behavior is normal. Her mood appears not anxious.    ED Course  Procedures (including critical care time)  Medications  ketorolac (TORADOL) injection 60 mg (60 mg Intramuscular Given 04/02/14 1028)  diazepam (VALIUM) injection 5 mg (5 mg Intramuscular Given 04/02/14 1029)  dexamethasone (DECADRON) injection 10 mg (10 mg Intravenous Given 04/02/14 1028)    Pain better, not gone, advised her pain would not go away that quickly.   Labs Review Labs Reviewed - No data to display Imaging Review Dg Lumbar Spine Complete  04/02/2014   CLINICAL DATA:  Low back and pelvic pain.  EXAM: LUMBAR SPINE - COMPLETE 4+ VIEW  COMPARISON:  DG PELVIS 1-2 VIEWS dated 04/02/2014; CT ABD/PELVIS W CM dated 05/10/2013  FINDINGS: Very mild dextro convex curvature of the lumbar spine may be positional. Alignment is otherwise anatomic. No pars defects. Vertebral body height is maintained. Minimal endplate degenerative change in the lower thoracic spine, incidentally imaged.  IMPRESSION: 1. No acute findings. 2. No degenerative changes in the lumbar spine. 3. Minimal degenerative disc disease incidentally imaged in the lower thoracic spine.   Electronically Signed   By: Leanna BattlesMelinda  Blietz M.D.   On: 04/02/2014 10:56   Dg Pelvis 1-2 Views  04/02/2014   CLINICAL DATA:  Back pain.  EXAM: PELVIS - 1-2 VIEW  COMPARISON:  CT ABD/PELVIS W CM dated 05/10/2013  FINDINGS: Hips  are symmetric. No joint space narrowing or degenerative change. No acute osseous abnormality. Intrauterine contraceptive device is seen in the right anatomic pelvis.  IMPRESSION: No findings to explain the patient's pain.   Electronically Signed   By: Leanna BattlesMelinda  Blietz M.D.   On: 04/02/2014 10:55     EKG Interpretation None      MDM  patient has acute low back pain without significant abnormalities on her x-ray. She has no history of back problems in the past. She may have injured herself during her sleep as her husband describe she throws herself over when she turns in the bed. Patient was given conservative treatment. She can be seen by her  PCP or orthopedics if she is not improving. She has no worrisome symptoms such as incontinence or perineal numbness.    Final diagnoses:  Back pain   New Prescriptions   CYCLOBENZAPRINE (FLEXERIL) 10 MG TABLET    Take 1 tablet (10 mg total) by mouth 3 (three) times daily as needed (muscle pain).   NAPROXEN (NAPROSYN) 500 MG TABLET    Take 1 tablet (500 mg total) by mouth 2 (two) times daily with a meal.    Plan discharge   Devoria Albe, MD, Franz Dell, MD 04/02/14 1143

## 2014-04-02 NOTE — Progress Notes (Signed)
[facility] provided pt with a list of primary care resources and a [facility] Orange Card application to help patient establish primary care.  °

## 2014-06-18 ENCOUNTER — Emergency Department (HOSPITAL_COMMUNITY)
Admission: EM | Admit: 2014-06-18 | Discharge: 2014-06-18 | Disposition: A | Discharge status: Home / Self Care | Payer: BC Managed Care – PPO | Attending: Emergency Medicine | Admitting: Emergency Medicine

## 2014-06-18 ENCOUNTER — Encounter (HOSPITAL_COMMUNITY): Payer: Self-pay | Admitting: Emergency Medicine

## 2014-06-18 DIAGNOSIS — Z79899 Other long term (current) drug therapy: Secondary | ICD-10-CM | POA: Insufficient documentation

## 2014-06-18 DIAGNOSIS — T782XXA Anaphylactic shock, unspecified, initial encounter: Secondary | ICD-10-CM

## 2014-06-18 DIAGNOSIS — T7809XA Anaphylactic reaction due to other food products, initial encounter: Secondary | ICD-10-CM | POA: Insufficient documentation

## 2014-06-18 DIAGNOSIS — Z791 Long term (current) use of non-steroidal anti-inflammatories (NSAID): Secondary | ICD-10-CM | POA: Insufficient documentation

## 2014-06-18 DIAGNOSIS — R109 Unspecified abdominal pain: Secondary | ICD-10-CM | POA: Insufficient documentation

## 2014-06-18 DIAGNOSIS — R0602 Shortness of breath: Secondary | ICD-10-CM | POA: Insufficient documentation

## 2014-06-18 DIAGNOSIS — Y9229 Other specified public building as the place of occurrence of the external cause: Secondary | ICD-10-CM | POA: Insufficient documentation

## 2014-06-18 DIAGNOSIS — T628X1A Toxic effect of other specified noxious substances eaten as food, accidental (unintentional), initial encounter: Secondary | ICD-10-CM | POA: Insufficient documentation

## 2014-06-18 DIAGNOSIS — F17200 Nicotine dependence, unspecified, uncomplicated: Secondary | ICD-10-CM | POA: Insufficient documentation

## 2014-06-18 DIAGNOSIS — F411 Generalized anxiety disorder: Secondary | ICD-10-CM | POA: Insufficient documentation

## 2014-06-18 DIAGNOSIS — Y9389 Activity, other specified: Secondary | ICD-10-CM | POA: Insufficient documentation

## 2014-06-18 DIAGNOSIS — R197 Diarrhea, unspecified: Secondary | ICD-10-CM | POA: Insufficient documentation

## 2014-06-18 LAB — URINALYSIS, ROUTINE W REFLEX MICROSCOPIC
BILIRUBIN URINE: NEGATIVE
Glucose, UA: NEGATIVE mg/dL
Hgb urine dipstick: NEGATIVE
KETONES UR: NEGATIVE mg/dL
LEUKOCYTES UA: NEGATIVE
NITRITE: NEGATIVE
PROTEIN: NEGATIVE mg/dL
Specific Gravity, Urine: 1.001 — ABNORMAL LOW (ref 1.005–1.030)
Urobilinogen, UA: 0.2 mg/dL (ref 0.0–1.0)
pH: 6.5 (ref 5.0–8.0)

## 2014-06-18 LAB — POC URINE PREG, ED: Preg Test, Ur: NEGATIVE

## 2014-06-18 MED ORDER — FAMOTIDINE IN NACL 20-0.9 MG/50ML-% IV SOLN
20.0000 mg | Freq: Once | INTRAVENOUS | Status: AC
  Administered 2014-06-18: 20 mg via INTRAVENOUS
  Filled 2014-06-18: qty 50

## 2014-06-18 MED ORDER — SODIUM CHLORIDE 0.9 % IV BOLUS (SEPSIS)
1000.0000 mL | Freq: Once | INTRAVENOUS | Status: AC
  Administered 2014-06-18: 1000 mL via INTRAVENOUS

## 2014-06-18 MED ORDER — EPINEPHRINE 0.3 MG/0.3ML IJ SOAJ: 0.3000 mg | INTRAMUSCULAR | Status: AC | PRN

## 2014-06-18 MED ORDER — METHYLPREDNISOLONE SODIUM SUCC 125 MG IJ SOLR
125.0000 mg | Freq: Once | INTRAMUSCULAR | Status: AC
  Administered 2014-06-18: 125 mg via INTRAVENOUS
  Filled 2014-06-18: qty 2

## 2014-06-18 MED ORDER — EPINEPHRINE 0.3 MG/0.3ML IJ SOAJ
0.3000 mg | Freq: Once | INTRAMUSCULAR | Status: AC
  Administered 2014-06-18: 0.3 mg via INTRAMUSCULAR
  Filled 2014-06-18: qty 0.3

## 2014-06-18 MED ORDER — DIPHENHYDRAMINE HCL 50 MG/ML IJ SOLN
25.0000 mg | Freq: Once | INTRAMUSCULAR | Status: AC
  Administered 2014-06-18: 25 mg via INTRAVENOUS
  Filled 2014-06-18: qty 1

## 2014-06-18 MED ORDER — PREDNISONE 20 MG PO TABS: 60.0000 mg | ORAL_TABLET | Freq: Every day | ORAL | Status: DC

## 2014-06-18 MED ORDER — DIPHENHYDRAMINE HCL 25 MG PO TABS: 50.0000 mg | ORAL_TABLET | ORAL | Status: AC | PRN

## 2014-06-18 NOTE — Discharge Instructions (Signed)

## 2014-06-18 NOTE — ED Notes (Addendum)
Pt c/o allergic reaction.  Sts "I feel really dry and my throat and lips feel weird.  My lips may be swollen.  I've had diarrhea twice."  Pt reports that she ate around 90 mins ago and thinks the sandwich had horseradish on the bun.  EPI Pen administered around ago.  Sts this seems like a typical allergic reaction.

## 2014-06-18 NOTE — ED Provider Notes (Signed)
CSN: [id number]     Arrival date & time 06/18/14  1739 History   First MD Initiated Contact with Patient 06/18/14 1743     Chief Complaint  Patient presents with  . Allergic Reaction     (Consider location/radiation/quality/duration/timing/severity/associated sxs/prior Treatment) HPI 23 year old female presents for concerns for an allergic reaction. 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 She had a burger at [employer] but they state only had cheeseand meat in the bun. Approximately 30 minutes after this she felt like she was getting flushed, had throat closing sensation, felt like her lips were swelling, and then had a couple loose bowel movements. Has had mild lower abdominal cramping. She states this feels like her multiple episodes of allergic reactions. The diarrhea happens sooner than it normally does but otherwise everything is like before. No rash but usually does not get a rash. She took her EpiPen about 30 minutes prior to arrival and noted it was expired. She's not feel better and feels like she's getting worse. Her significant other notes that he thinks her voice is more hoarse.  History reviewed. No pertinent past medical history. Past Surgical History  Procedure Laterality Date  . Eye surgery     Family History  Problem Relation Age of Onset  . Hypertension Father    History  Substance Use Topics  . Smoking status: Current Some Day Smoker -- 0.25 packs/day    Types: Cigarettes  . Smokeless tobacco: Never Used  . Alcohol Use: Yes     Comment: rare   OB History   Grav Para Term Preterm Abortions TAB SAB Ect Mult Living                 Review of Systems  HENT: Positive for voice change.   Respiratory: Positive for shortness of breath.   Gastrointestinal: Positive for abdominal pain and diarrhea. Negative for vomiting.  Genitourinary: Negative for dysuria.  Skin: Positive for rash.  All other systems reviewed and are negative.     Allergies   Other  Home Medications   Prior to Admission medications   Medication Sig Start Date End Date Taking? Authorizing Provider  cyclobenzaprine (FLEXERIL) 10 MG tablet Take 1 tablet (10 mg total) by mouth 3 (three) times daily as needed (muscle pain). 04/02/14   Ward GivensIva L Knapp, MD  levonorgestrel (MIRENA) 20 MCG/24HR IUD 1 each by Intrauterine route once.    Historical Provider, MD  naproxen (NAPROSYN) 500 MG tablet Take 1 tablet (500 mg total) by mouth 2 (two) times daily with a meal. 04/02/14   Ward GivensIva L Knapp, MD   There were no vitals taken for this visit. Physical Exam  Nursing note and vitals reviewed. Constitutional: She is oriented to person, place, and time. She appears well-developed and well-nourished.  HENT:  Head: Normocephalic and atraumatic.  Right Ear: External ear normal.  Left Ear: External ear normal.  Nose: Nose normal.  No lip swelling or obvious oropharyngeal edema/swelling  Eyes: Right eye exhibits no discharge. Left eye exhibits no discharge.  Cardiovascular: Normal rate, regular rhythm and normal heart sounds.   Pulmonary/Chest: Effort normal and breath sounds normal. No stridor. She has no wheezes.  Abdominal: Soft. She exhibits no distension. There is no tenderness.  Neurological: She is alert and oriented to person, place, and time.  Skin: Skin is warm and dry. No rash noted.  Psychiatric: Her mood appears anxious.    ED Course  Procedures (including critical care time) Labs Review Labs  Reviewed  URINALYSIS, ROUTINE W REFLEX MICROSCOPIC - Abnormal; Notable for the following:    Specific Gravity, Urine 1.001 (*)    All other components within normal limits  POC URINE PREG, ED    Imaging Review No results found.   EKG Interpretation None      MDM   Final diagnoses:  Anaphylaxis, initial encounter    Patient symptoms resolved after being given epi IM, solumedrol, Benadryl, and Pepcid. She was also given fluids. Patient possibly had allergic reaction  versus anaphylaxis versus other type reaction from her food. Given the she's having the throat symptoms though she was given epinephrine. Her symptoms resolved and she was watched for several hours had no recurrence. She couple borderline blood pressures in the mid 90s but was asymptomatic and her blood pressure then returned in the 100s. I feel she is stable for discharge, will DC with EpiPen prescription and steroid burst    Audree CamelScott T Goldston, MD 06/19/14 0008

## 2014-07-19 ENCOUNTER — Encounter (HOSPITAL_COMMUNITY): Payer: Self-pay | Admitting: Emergency Medicine

## 2014-07-19 ENCOUNTER — Emergency Department (HOSPITAL_COMMUNITY)
Admission: EM | Admit: 2014-07-19 | Discharge: 2014-07-19 | Disposition: A | Discharge status: Home / Self Care | Payer: BC Managed Care – PPO | Attending: Emergency Medicine | Admitting: Emergency Medicine

## 2014-07-19 ENCOUNTER — Emergency Department (HOSPITAL_COMMUNITY): Payer: BC Managed Care – PPO

## 2014-07-19 DIAGNOSIS — T781XXA Other adverse food reactions, not elsewhere classified, initial encounter: Secondary | ICD-10-CM

## 2014-07-19 DIAGNOSIS — R112 Nausea with vomiting, unspecified: Secondary | ICD-10-CM | POA: Insufficient documentation

## 2014-07-19 DIAGNOSIS — F17200 Nicotine dependence, unspecified, uncomplicated: Secondary | ICD-10-CM | POA: Insufficient documentation

## 2014-07-19 DIAGNOSIS — R6889 Other general symptoms and signs: Secondary | ICD-10-CM | POA: Insufficient documentation

## 2014-07-19 DIAGNOSIS — F411 Generalized anxiety disorder: Secondary | ICD-10-CM | POA: Insufficient documentation

## 2014-07-19 DIAGNOSIS — T628X1A Toxic effect of other specified noxious substances eaten as food, accidental (unintentional), initial encounter: Secondary | ICD-10-CM | POA: Insufficient documentation

## 2014-07-19 DIAGNOSIS — Y9289 Other specified places as the place of occurrence of the external cause: Secondary | ICD-10-CM | POA: Insufficient documentation

## 2014-07-19 DIAGNOSIS — Z79899 Other long term (current) drug therapy: Secondary | ICD-10-CM | POA: Insufficient documentation

## 2014-07-19 DIAGNOSIS — Y9389 Activity, other specified: Secondary | ICD-10-CM | POA: Insufficient documentation

## 2014-07-19 MED ORDER — DIPHENHYDRAMINE HCL 50 MG/ML IJ SOLN
25.0000 mg | Freq: Once | INTRAMUSCULAR | Status: AC
  Administered 2014-07-19: 25 mg via INTRAVENOUS
  Filled 2014-07-19: qty 1

## 2014-07-19 MED ORDER — LORAZEPAM 2 MG/ML IJ SOLN
1.0000 mg | Freq: Once | INTRAMUSCULAR | Status: AC
Start: 2014-07-19 — End: 2014-07-19
  Administered 2014-07-19: 1 mg via INTRAVENOUS
  Filled 2014-07-19: qty 1

## 2014-07-19 MED ORDER — FAMOTIDINE IN NACL 20-0.9 MG/50ML-% IV SOLN
20.0000 mg | Freq: Once | INTRAVENOUS | Status: AC
  Administered 2014-07-19: 20 mg via INTRAVENOUS
  Filled 2014-07-19: qty 50

## 2014-07-19 NOTE — ED Notes (Addendum)
Pt c/o allergic reaction after eating nuggets from [employer]. Pt states she had same reaction last month after eating [employer]. Pt states she took 1 Benadryl PTA. Pt states she "feels out of it" "can't focus"  Pt c/o throat itching and burning, dry cough noted Pt states her EPI pens are expired and is unable financially to get another one.

## 2014-07-19 NOTE — ED Provider Notes (Signed)
CSN: [id number]     Arrival date & time 07/19/14  1926 History   First MD Initiated Contact with Patient 07/19/14 1944     Chief Complaint  Patient presents with  . Allergic Reaction     (Consider location/radiation/quality/duration/timing/severity/associated sxs/prior Treatment) Patient is a 23 y.o. female presenting with allergic reaction. The history is provided by the patient.  Allergic Reaction Presenting symptoms: no difficulty swallowing and no rash   pt c/o feeling as if she is having an allergic rxn.  States shortly prior to onset symptoms, approx 1 hr ago, she ate a couple [employer].  States shortly thereafter, felt tingly/itchy sensation in throat, then noted episode nv. Emesis c/w recently ingested liquids/food, no bloody or bilious emesis. No abd pain. No diarrhea.  States was feeling well/asymptomatic all day prior. Denies other known or new food ingestion. States similar symptoms in past w horseradish/certain foods. No recent med use/new meds. No change in any other diet, and/or personal or home product. No skin rash/hives. No wheezing. No fainting. States now she feels dehydrated.     History reviewed. No pertinent past medical history. Past Surgical History  Procedure Laterality Date  . Eye surgery     Family History  Problem Relation Age of Onset  . Hypertension Father    History  Substance Use Topics  . Smoking status: Current Some Day Smoker -- 0.25 packs/day    Types: Cigarettes  . Smokeless tobacco: Never Used  . Alcohol Use: Yes     Comment: rare   OB History   Grav Para Term Preterm Abortions TAB SAB Ect Mult Living                 Review of Systems  Constitutional: Negative for fever and chills.  HENT: Negative for trouble swallowing and voice change.   Eyes: Negative for redness.  Respiratory: Negative for cough and shortness of breath.   Cardiovascular: Negative for chest pain.  Gastrointestinal: Positive for nausea and  vomiting. Negative for abdominal pain, diarrhea and abdominal distention.  Genitourinary: Negative for flank pain.  Musculoskeletal: Negative for back pain and neck pain.  Skin: Negative for rash.  Neurological: Negative for headaches.  Hematological: Does not bruise/bleed easily.  Psychiatric/Behavioral: Negative for confusion.      Allergies  Other  Home Medications   Prior to Admission medications   Medication Sig Start Date End Date Taking? Authorizing Provider  diphenhydrAMINE (BENADRYL) 25 MG tablet Take 2 tablets (50 mg total) by mouth every 4 (four) hours as needed for itching or allergies. 06/18/14  Yes Audree Camel, MD  EPINEPHrine (EPIPEN) 0.3 mg/0.3 mL IJ SOAJ injection Inject 0.3 mLs (0.3 mg total) into the muscle as needed. 06/18/14  Yes Audree Camel, MD  levonorgestrel (MIRENA) 20 MCG/24HR IUD 1 each by Intrauterine route once.   Yes Historical Provider, MD   BP 133/89  Pulse 104  Temp(Src) 98 F (36.7 C) (Oral)  Resp 22  Wt 238 lb 9.6 oz (108.228 kg)  SpO2 99% Physical Exam  Nursing note and vitals reviewed. Constitutional: She is oriented to person, place, and time. She appears well-developed and well-nourished. No distress.  Anxious appearing.   HENT:  Nose: Nose normal.  Mouth/Throat: Oropharynx is clear and moist.  Eyes: Conjunctivae are normal. No scleral icterus.  Neck: Normal range of motion. Neck supple. No tracheal deviation present. No thyromegaly present.  Cardiovascular: Normal rate, regular rhythm, normal heart sounds and intact distal pulses.  Exam reveals  no gallop and no friction rub.   No murmur heard. Pulmonary/Chest: Effort normal and breath sounds normal. No respiratory distress.  Abdominal: Soft. Normal appearance and bowel sounds are normal. She exhibits no distension and no mass. There is no tenderness. There is no rebound and no guarding.  Genitourinary:  No cva tenderness  Musculoskeletal: She exhibits no edema and no  tenderness.  Lymphadenopathy:    She has no cervical adenopathy.  Neurological: She is alert and oriented to person, place, and time.  Skin: Skin is warm and dry. No rash noted. She is not diaphoretic.  Psychiatric:  anxious    ED Course  Procedures (including critical care time) Labs Review  Results for orders placed during the hospital encounter of 06/18/14  URINALYSIS, ROUTINE W REFLEX MICROSCOPIC      Result Value Ref Range   Color, Urine YELLOW  YELLOW   APPearance CLEAR  CLEAR   Specific Gravity, Urine 1.001 (*) 1.005 - 1.030   pH 6.5  5.0 - 8.0   Glucose, UA NEGATIVE  NEGATIVE mg/dL   Hgb urine dipstick NEGATIVE  NEGATIVE   Bilirubin Urine NEGATIVE  NEGATIVE   Ketones, ur NEGATIVE  NEGATIVE mg/dL   Protein, ur NEGATIVE  NEGATIVE mg/dL   Urobilinogen, UA 0.2  0.0 - 1.0 mg/dL   Nitrite NEGATIVE  NEGATIVE   Leukocytes, UA NEGATIVE  NEGATIVE  POC URINE PREG, ED      Result Value Ref Range   Preg Test, Ur NEGATIVE  NEGATIVE   Dg Chest 2 View  07/19/2014   CLINICAL DATA:  23 year old female cough and throat burning. Allergic reaction.  EXAM: CHEST  2 VIEW  COMPARISON:  None.  FINDINGS: The cardiomediastinal silhouette is unremarkable.  There is no evidence of focal airspace disease, pulmonary edema, suspicious pulmonary nodule/mass, pleural effusion, or pneumothorax. No acute bony abnormalities are identified.  IMPRESSION: No active cardiopulmonary disease.   Electronically Signed   By: Laveda AbbeJeff  Hu M.D.   On: 07/19/2014 20:32      EKG Interpretation   Date/Time:  Saturday July 19 2014 19:50:21 EDT Ventricular Rate:  96 PR Interval:  180 QRS Duration: 90 QT Interval:  335 QTC Calculation: 423 R Axis:   45 Text Interpretation:  Sinus rhythm Nonspecific ST abnormality `similar  appearance to ecg 6/15 Confirmed by [facility]  MD, Caryn BeeKEVIN ([id number]) on 07/19/2014  7:59:33 PM      MDM  Pt states feels she is having an allergic rxn.  Benadryl iv, pepcid iv.     Pt also appears  somewhat anxious, ativan .5 mg iv.  Reviewed nursing notes and prior charts for additional history.   Recheck pt asymptomatic. Hr 78, rr 16, pulse ox 99% ra.  Pt appears stable for d/c.     Suzi RootsKevin E Shannin Naab, MD 07/19/14 2140

## 2014-07-19 NOTE — Discharge Instructions (Signed)
Follow up with allergist in the next 1-2 weeks - call Monday to arrange appointment. Return to ER if worse, new symptoms, fevers, trouble breathing or swallowing, other concern.  You were given medication in the ER that causes drowsiness - no driving for the next 4 hours.    Food Allergy A food allergy occurs from eating something you are sensitive to. Food allergies occur in all age groups. It may be passed to you from your parents (heredity).  CAUSES  Some common causes are cow's milk, seafood, eggs, nuts (including peanut butter), wheat, and soybeans. SYMPTOMS  Common problems are:   Swelling around the mouth.  An itchy, red rash.  Hives.  Vomiting.  Diarrhea. Severe allergic reactions are life-threatening. This reaction is called anaphylaxis. It can cause the mouth and throat to swell. This makes it hard to breathe and swallow. In severe reactions, only a small amount of food may be fatal within seconds. HOME CARE INSTRUCTIONS   If you are unsure what caused the reaction, keep a diary of foods eaten and symptoms that followed. Avoid foods that cause reactions.  If hives or rash are present:  Take medicines as directed.  Use an over-the-counter antihistamine (diphenhydramine) to treat hives and itching as needed.  Apply cold compresses to the skin or take baths in cool water. Avoid hot baths or showers. These will increase the redness and itching.  If you are severely allergic:  Hospitalization is often required following a severe reaction.  Wear a medical alert bracelet or necklace that describes the allergy.  Carry your anaphylaxis kit or epinephrine injection with you at all times. Both you and your family members should know how to use this. This can be lifesaving if you have a severe reaction. If epinephrine is used, it is important for you to seek immediate medical care or call your local emergency services (911 in [place] When the epinephrine wears off, it can be  followed by a delayed reaction, which can be fatal.  Replace your epinephrine immediately after use in case of another reaction.  Ask your caregiver for instructions if you have not been taught how to use an epinephrine injection.  Do not drive until medicines used to treat the reaction have worn off, unless approved by your caregiver. SEEK MEDICAL CARE IF:   You suspect a food allergy. Symptoms generally happen within 30 minutes of eating a food.  Your symptoms have not gone away within 2 days. See your caregiver sooner if symptoms are getting worse.  You develop new symptoms.  You want to retest yourself with a food or drink you think causes an allergic reaction. Never do this if an anaphylactic reaction to that food or drink has happened before.  There is a return of the symptoms which brought you to your caregiver. SEEK IMMEDIATE MEDICAL CARE IF:   You have trouble breathing, are wheezing, or you have a tight feeling in your chest or throat.  You have a swollen mouth, or you have hives, swelling, or itching all over your body. Use your epinephrine injection immediately. This is given into the outside of your thigh, deep into the muscle. Following use of the epinephrine injection, seek help right away. Seek immediate medical care or call your local emergency services (911 in [place] MAKE SURE YOU:   Understand these instructions.  Will watch your condition.  Will get help right away if you are not doing well or get worse. Document Released: 12/09/2000 Document Revised: 03/05/2012 Document  Reviewed: 07/31/2008 ExitCare Patient Information 2015 [place], [place]. This information is not intended to replace advice given to you by your health care provider. Make sure you discuss any questions you have with your health care provider.

## 2014-11-29 ENCOUNTER — Encounter (HOSPITAL_COMMUNITY): Payer: Self-pay

## 2014-11-29 ENCOUNTER — Emergency Department (HOSPITAL_COMMUNITY)
Admission: EM | Admit: 2014-11-29 | Discharge: 2014-11-29 | Disposition: A | Discharge status: Home / Self Care | Payer: BC Managed Care – PPO | Attending: Emergency Medicine | Admitting: Emergency Medicine

## 2014-11-29 DIAGNOSIS — Z23 Encounter for immunization: Secondary | ICD-10-CM | POA: Insufficient documentation

## 2014-11-29 DIAGNOSIS — Y998 Other external cause status: Secondary | ICD-10-CM | POA: Insufficient documentation

## 2014-11-29 DIAGNOSIS — Y9289 Other specified places as the place of occurrence of the external cause: Secondary | ICD-10-CM | POA: Insufficient documentation

## 2014-11-29 DIAGNOSIS — S61219A Laceration without foreign body of unspecified finger without damage to nail, initial encounter: Secondary | ICD-10-CM

## 2014-11-29 DIAGNOSIS — W260XXA Contact with knife, initial encounter: Secondary | ICD-10-CM | POA: Insufficient documentation

## 2014-11-29 DIAGNOSIS — S61217A Laceration without foreign body of left little finger without damage to nail, initial encounter: Secondary | ICD-10-CM | POA: Insufficient documentation

## 2014-11-29 DIAGNOSIS — Y9389 Activity, other specified: Secondary | ICD-10-CM | POA: Insufficient documentation

## 2014-11-29 DIAGNOSIS — Z720 Tobacco use: Secondary | ICD-10-CM | POA: Insufficient documentation

## 2014-11-29 MED ORDER — LIDOCAINE HCL 1 % IJ SOLN
10.0000 mL | Freq: Once | INTRAMUSCULAR | Status: AC
  Administered 2014-11-29: 10 mL

## 2014-11-29 MED ORDER — TETANUS-DIPHTH-ACELL PERTUSSIS 5-2.5-18.5 LF-MCG/0.5 IM SUSP
0.5000 mL | Freq: Once | INTRAMUSCULAR | Status: AC
  Administered 2014-11-29: 0.5 mL via INTRAMUSCULAR

## 2014-11-29 NOTE — ED Notes (Addendum)
Pt c/o anterior 5th digit laceration on L hand.  Pain score 10/10.  Pt report some numbness to area.  Pt has full ROM.  Last tetanus unknown.

## 2014-11-29 NOTE — ED Provider Notes (Signed)
CSN: [id number]     Arrival date & time 11/29/14  [id number] History   First MD Initiated Contact with Patient 11/29/14 [phone]     Chief Complaint  Patient presents with  . Laceration     (Consider location/radiation/quality/duration/timing/severity/associated sxs/prior Treatment) HPI Comments: Patient presents today with a laceration of the palmar aspect of the left 5th digit that she sustained just prior to arrival.  She reports that she was cutting a bagel with a serrated knife and the knife slipped and cut her finger.  Bleeding is controlled at this time.  No treatment prior to arrival.  She denies numbness, tingling, or swelling of the finger.  She is unsure of the date of her last tetanus.    Patient is a 23 y.o. female presenting with skin laceration. The history is provided by the patient.  Laceration   History reviewed. No pertinent past medical history. Past Surgical History  Procedure Laterality Date  . Eye surgery     Family History  Problem Relation Age of Onset  . Hypertension Father    History  Substance Use Topics  . Smoking status: Current Some Day Smoker -- 0.25 packs/day    Types: Cigarettes  . Smokeless tobacco: Never Used  . Alcohol Use: Yes     Comment: rare   OB History    No data available     Review of Systems  All other systems reviewed and are negative.     Allergies  Other  Home Medications   Prior to Admission medications   Medication Sig Start Date End Date Taking? Authorizing Provider  diphenhydrAMINE (BENADRYL) 25 MG tablet Take 2 tablets (50 mg total) by mouth every 4 (four) hours as needed for itching or allergies. 06/18/14   Audree CamelScott T Goldston, MD  EPINEPHrine (EPIPEN) 0.3 mg/0.3 mL IJ SOAJ injection Inject 0.3 mLs (0.3 mg total) into the muscle as needed. 06/18/14   Audree CamelScott T Goldston, MD  levonorgestrel (MIRENA) 20 MCG/24HR IUD 1 each by Intrauterine route once.    Historical Provider, MD   BP 133/73 mmHg  Pulse 87  Temp(Src) 97.4 F  (36.3 C) (Oral)  Resp 20  SpO2 98% Physical Exam  Constitutional: She appears well-developed and well-nourished.  HENT:  Head: Normocephalic and atraumatic.  Cardiovascular: Normal rate, regular rhythm and normal heart sounds.   Pulmonary/Chest: Effort normal and breath sounds normal.  Musculoskeletal: Normal range of motion.  Full ROM of the left 5th finger.  No bony tenderness of the left 5th finger.  Neurological: She is alert.  Muscle strength 5/5 against resistance at the level of the MCP, PIP, and DIP of the left 5th digit.  Distal sensation of the left 5th digit intact.  Skin: Skin is warm and dry.  2 cm linear superficial laceration of the palmar aspect of the left 5th digit in between the PIP and the DIP.  Bleeding controlled.  Nursing note and vitals reviewed.   ED Course  NERVE BLOCK Date/Time: 11/29/2014 9:48 AM Performed by: Santiago GladLAISURE, Cyla Haluska Authorized by: Santiago GladLAISURE, Yarielys Beed Consent: Verbal consent obtained. Written consent not obtained. Risks and benefits: risks, benefits and alternatives were discussed Consent given by: patient Patient understanding: patient states understanding of the procedure being performed Patient consent: the patient's understanding of the procedure matches consent given Procedure consent: procedure consent matches procedure scheduled Site marked: the operative site was marked Patient identity confirmed: verbally with patient Time out: Immediately prior to procedure a "time out" was called to verify the correct patient,  procedure, equipment, support staff and site/side marked as required. Indications: pain relief Nerve block body site: 5th finger. Laterality: left Preparation: Patient was prepped and draped in the usual sterile fashion. Patient position: sitting Needle gauge: 25 G Location technique: anatomical landmarks Local anesthetic: lidocaine 2% without epinephrine Anesthetic total: 5 ml Outcome: pain improved Patient tolerance:  Patient tolerated the procedure well with no immediate complications   (including critical care time) Labs Review Labs Reviewed - No data to display  Imaging Review No results found.   EKG Interpretation None     LACERATION REPAIR Performed by: Santiago GladLaisure, Oshae Simmering Authorized by: Santiago GladLaisure, Myrl Bynum Consent: Verbal consent obtained. Risks and benefits: risks, benefits and alternatives were discussed Consent given by: patient Patient identity confirmed: provided demographic data Prepped and Draped in normal sterile fashion Wound explored  Laceration Location: left 5th digit  Laceration Length: 2 cm  No Foreign Bodies seen or palpated  Anesthesia: digital block  Local anesthetic: lidocaine 2% without epinephrine  Anesthetic total: 5 ml  Irrigation method: syringe Amount of cleaning: standard  Skin closure: 4-0 Prolene  Number of sutures: 4  Technique: Simple interrupted  Patient tolerance: Patient tolerated the procedure well with no immediate complications.  MDM   Final diagnoses:  None   Patient presenting with a superficial laceration of her left 5th digit.  Bleeding controlled.  No apparent tendon involvement.   Full ROM of the left 5th finger.   No bony tenderness.  Patient neurovascularly intact.  Laceration sutured without difficulty.  Tetanus updated.  Patient stable for discharge.  Return precautions given.    Santiago GladHeather Darlina Mccaughey, PA-C 11/29/14 [id number]  Enid SkeensJoshua M Zavitz, MD 11/29/14 [phone]

## 2015-07-11 ENCOUNTER — Emergency Department (HOSPITAL_COMMUNITY): Payer: 59

## 2015-07-11 ENCOUNTER — Encounter (HOSPITAL_COMMUNITY): Payer: Self-pay | Admitting: Emergency Medicine

## 2015-07-11 ENCOUNTER — Emergency Department (HOSPITAL_COMMUNITY)
Admission: EM | Admit: 2015-07-11 | Discharge: 2015-07-11 | Disposition: A | Discharge status: Home / Self Care | Payer: 59 | Attending: Emergency Medicine | Admitting: Emergency Medicine

## 2015-07-11 DIAGNOSIS — M549 Dorsalgia, unspecified: Secondary | ICD-10-CM

## 2015-07-11 DIAGNOSIS — M545 Low back pain: Secondary | ICD-10-CM | POA: Insufficient documentation

## 2015-07-11 DIAGNOSIS — K5900 Constipation, unspecified: Secondary | ICD-10-CM

## 2015-07-11 DIAGNOSIS — G8929 Other chronic pain: Secondary | ICD-10-CM | POA: Diagnosis not present

## 2015-07-11 DIAGNOSIS — Z720 Tobacco use: Secondary | ICD-10-CM | POA: Insufficient documentation

## 2015-07-11 DIAGNOSIS — Z3202 Encounter for pregnancy test, result negative: Secondary | ICD-10-CM | POA: Insufficient documentation

## 2015-07-11 DIAGNOSIS — R300 Dysuria: Secondary | ICD-10-CM | POA: Insufficient documentation

## 2015-07-11 LAB — URINALYSIS, ROUTINE W REFLEX MICROSCOPIC
Bilirubin Urine: NEGATIVE
GLUCOSE, UA: NEGATIVE mg/dL
Hgb urine dipstick: NEGATIVE
KETONES UR: NEGATIVE mg/dL
LEUKOCYTES UA: NEGATIVE
Nitrite: NEGATIVE
Protein, ur: NEGATIVE mg/dL
SPECIFIC GRAVITY, URINE: 1.023 (ref 1.005–1.030)
Urobilinogen, UA: 1 mg/dL (ref 0.0–1.0)
pH: 6.5 (ref 5.0–8.0)

## 2015-07-11 LAB — POC URINE PREG, ED: Preg Test, Ur: NEGATIVE

## 2015-07-11 MED ORDER — DIAZEPAM 5 MG PO TABS
5.0000 mg | ORAL_TABLET | Freq: Once | ORAL | Status: AC
  Administered 2015-07-11: 5 mg via ORAL
  Filled 2015-07-11: qty 1

## 2015-07-11 MED ORDER — TRAMADOL HCL 50 MG PO TABS: 50.0000 mg | ORAL_TABLET | Freq: Four times a day (QID) | ORAL | Status: DC | PRN

## 2015-07-11 MED ORDER — KETOROLAC TROMETHAMINE 60 MG/2ML IM SOLN
60.0000 mg | Freq: Once | INTRAMUSCULAR | Status: AC
  Administered 2015-07-11: 60 mg via INTRAMUSCULAR
  Filled 2015-07-11: qty 2

## 2015-07-11 MED ORDER — METHOCARBAMOL 500 MG PO TABS: 500.0000 mg | ORAL_TABLET | Freq: Two times a day (BID) | ORAL | Status: DC

## 2015-07-11 NOTE — ED Provider Notes (Signed)
CSN: [id number]     Arrival date & time 07/11/15  1917 History   First MD Initiated Contact with Patient 07/11/15 2010     Chief Complaint  Patient presents with  . Back Pain     (Consider location/radiation/quality/duration/timing/severity/associated sxs/prior Treatment) Patient is a 24 y.o. female presenting with back pain. The history is provided by the patient and medical records.  Back Pain Associated symptoms: dysuria     This is a 24 y.o. F with no significant past medical history, presenting to the ED for multiple complaints. Patient states she has history of chronic back pain, has not been bothering her until recently. States her PCP ordered an MRI last week which showed DDD, no other acute findings.  She states pain persisted and has now begun having muscle spasms.  She was taking vicodin for this, has since run out.  She denies any numbness or weakness of lower extremities.  No loss of bowel or bladder control.  Patient also reports some abdominal bloating.  She states she has been having regular bowel movements, however has been having to strain which is hurting her back as well.  She is freely passing flatus.  Denies nausea or vomiting, has continued eating and drinking normally.  Lastly, patient complains of dysuria.  She states it does not happen every time she urinates, however describes it as a "stinging" sensation. She denies any vaginal discharge. No new sexual partners or concern for STD.  Patient has Mirena in place.  VSS.  History reviewed. No pertinent past medical history. Past Surgical History  Procedure Laterality Date  . Eye surgery     Family History  Problem Relation Age of Onset  . Hypertension Father    History  Substance Use Topics  . Smoking status: Current Some Day Smoker -- 0.25 packs/day    Types: Cigarettes  . Smokeless tobacco: Never Used  . Alcohol Use: Yes     Comment: rare   OB History    No data available     Review of Systems   Genitourinary: Positive for dysuria.  Musculoskeletal: Positive for back pain.  All other systems reviewed and are negative.     Allergies  Other  Home Medications   Prior to Admission medications   Medication Sig Start Date End Date Taking? Authorizing Provider  ibuprofen (ADVIL,MOTRIN) 200 MG tablet Take 400 mg by mouth every 6 (six) hours as needed for mild pain.   Yes Historical Provider, MD  LORazepam (ATIVAN) 0.5 MG tablet Take 0.5 mg by mouth every 6 (six) hours as needed for anxiety.  06/11/15 06/10/16 Yes Historical Provider, MD  diphenhydrAMINE (BENADRYL) 25 MG tablet Take 2 tablets (50 mg total) by mouth every 4 (four) hours as needed for itching or allergies. Patient not taking: Reported on 11/29/2014 06/18/14   Pricilla LovelessScott Goldston, MD  EPINEPHrine (EPIPEN) 0.3 mg/0.3 mL IJ SOAJ injection Inject 0.3 mLs (0.3 mg total) into the muscle as needed. 06/18/14   Pricilla LovelessScott Goldston, MD  levonorgestrel (MIRENA) 20 MCG/24HR IUD 1 each by Intrauterine route once.    Historical Provider, MD   BP 100/43 mmHg  Pulse 62  Resp 19  Ht 5\' 3"  (1.6 m)  Wt 270 lb (122.471 kg)  BMI 47.84 kg/m2  SpO2 96%   Physical Exam  Constitutional: She is oriented to person, place, and time. She appears well-developed and well-nourished. No distress.  Morbidly obese  HENT:  Head: Normocephalic and atraumatic.  Mouth/Throat: Oropharynx is clear and moist.  Eyes: Conjunctivae and EOM are normal. Pupils are equal, round, and reactive to light.  Neck: Normal range of motion. Neck supple.  Cardiovascular: Normal rate, regular rhythm and normal heart sounds.   Pulmonary/Chest: Effort normal and breath sounds normal. No respiratory distress. She has no wheezes.  Abdominal: Soft. Bowel sounds are normal. There is no tenderness. There is no guarding.  Musculoskeletal: Normal range of motion. She exhibits no edema.  Generalized lumbar pain without noted deformity; normal strength and sensation of BLE; normal gait   Neurological: She is alert and oriented to person, place, and time.  Skin: Skin is warm and dry. She is not diaphoretic.  Psychiatric: She has a normal mood and affect.  Nursing note and vitals reviewed.   ED Course  Procedures (including critical care time) Labs Review Labs Reviewed  URINALYSIS, ROUTINE W REFLEX MICROSCOPIC (NOT AT [facility])  POC URINE PREG, ED    Imaging Review Dg Abd 1 View  07/11/2015   CLINICAL DATA:  Low back pain. Patient has been seen by primary care physician and had an MRI which showed degenerative disc disease. Patient is run out of medications. No injury. Constipation.  EXAM: ABDOMEN - 1 VIEW  COMPARISON:  CT abdomen and pelvis 05/10/2013.  Pelvis 04/02/2014.  FINDINGS: Scattered gas and stool throughout the colon. No small or large bowel distention. No radiopaque stones. Intrauterine device. Visualized bones appear intact.  IMPRESSION: Normal nonobstructive bowel gas pattern.   Electronically Signed   By: Burman Nieves M.D.   On: 07/11/2015 21:16     EKG Interpretation None      MDM   Final diagnoses:  Constipation  Back pain, unspecified location  Dysuria   24 year old female here with multiple complaints. Back pain appears chronic, recent MRI which showed degenerative changes only. She has no focal neurologic deficits today to suggest cauda equina. Abdominal bloating which is likely secondary to repetitive narcotic use. She has no nausea or vomiting and I have low suspicion for small bowel obstruction. Lastly, she reports dysuria. Denies any vaginal discharge. No new sexual partners or concern for STD at this time. UA negative for signs of infection. Plain films with normal bowel gas pattern.  Patient was treated here with toradol and valium which she states did not help, however she remains without any signs of distress or toxicity.  Will d/c home with supportive care.  I did encourage her to use stool softeners as needed to help ease bowel movements.   FU with PCP next week.  Discussed plan with patient, he/she acknowledged understanding and agreed with plan of care.  Return precautions given for new or worsening symptoms.  Garlon Hatchet, PA-C 07/11/15 2243  Elwin Mocha, MD 07/11/15 2250

## 2015-07-11 NOTE — Discharge Instructions (Signed)
Take the prescribed medication as directed. May need to use stool softener like miralax, colace, dulcolax, etc to help ease bowel movements. Follow-up with your primary care physician. Return to the ED for new or worsening symptoms.

## 2015-07-11 NOTE — ED Notes (Signed)
Pt arrived to the ED with a complaint of lower back pain.  Pt has seen her PCP and they did a MRI which shows a degenerative disc disease.  Pt has been on medication but has run out of medications.  Pt states she is also bloated from not being able to have a bowel movement.  Last bowel movement was today but it was painful to do so.  Pt is also experiencing vaginal pain.

## 2015-10-15 ENCOUNTER — Encounter (HOSPITAL_COMMUNITY): Payer: Self-pay | Admitting: Emergency Medicine

## 2015-10-15 ENCOUNTER — Emergency Department (HOSPITAL_COMMUNITY)
Admission: EM | Admit: 2015-10-15 | Discharge: 2015-10-16 | Disposition: A | Discharge status: Home / Self Care | Payer: Self-pay | Attending: Emergency Medicine | Admitting: Emergency Medicine

## 2015-10-15 DIAGNOSIS — Z720 Tobacco use: Secondary | ICD-10-CM | POA: Insufficient documentation

## 2015-10-15 DIAGNOSIS — Z3202 Encounter for pregnancy test, result negative: Secondary | ICD-10-CM | POA: Insufficient documentation

## 2015-10-15 DIAGNOSIS — Z79899 Other long term (current) drug therapy: Secondary | ICD-10-CM | POA: Insufficient documentation

## 2015-10-15 DIAGNOSIS — K8050 Calculus of bile duct without cholangitis or cholecystitis without obstruction: Secondary | ICD-10-CM | POA: Insufficient documentation

## 2015-10-15 LAB — CBC
HCT: 40.7 % (ref 36.0–46.0)
Hemoglobin: 13.6 g/dL (ref 12.0–15.0)
MCH: 29.3 pg (ref 26.0–34.0)
MCHC: 33.4 g/dL (ref 30.0–36.0)
MCV: 87.7 fL (ref 78.0–100.0)
Platelets: 317 10*3/uL (ref 150–400)
RBC: 4.64 MIL/uL (ref 3.87–5.11)
RDW: 12.1 % (ref 11.5–15.5)
WBC: 10.6 10*3/uL — ABNORMAL HIGH (ref 4.0–10.5)

## 2015-10-15 LAB — COMPREHENSIVE METABOLIC PANEL
ALT: 22 U/L (ref 14–54)
AST: 20 U/L (ref 15–41)
Albumin: 3.8 g/dL (ref 3.5–5.0)
Alkaline Phosphatase: 82 U/L (ref 38–126)
Anion gap: 7 (ref 5–15)
BUN: 11 mg/dL (ref 6–20)
CO2: 25 mmol/L (ref 22–32)
Calcium: 9.2 mg/dL (ref 8.9–10.3)
Chloride: 107 mmol/L (ref 101–111)
Creatinine, Ser: 0.67 mg/dL (ref 0.44–1.00)
GFR calc Af Amer: >60 mL/min (ref >60)
GFR calc non Af Amer: >60 mL/min (ref >60)
Glucose, Bld: 99 mg/dL (ref 65–99)
Potassium: 3.8 mmol/L (ref 3.5–5.1)
Sodium: 139 mmol/L (ref 135–145)
Total Bilirubin: 0.5 mg/dL (ref 0.3–1.2)
Total Protein: 6.9 g/dL (ref 6.5–8.1)

## 2015-10-15 LAB — LIPASE, BLOOD: Lipase: 32 U/L (ref 11–51)

## 2015-10-15 MED ORDER — ONDANSETRON HCL 4 MG/2ML IJ SOLN
4.0000 mg | Freq: Once | INTRAMUSCULAR | Status: AC
  Administered 2015-10-15: 4 mg via INTRAVENOUS
  Filled 2015-10-15: qty 2

## 2015-10-15 NOTE — ED Notes (Signed)
Pt went to restroom didn't have enough for urine sample

## 2015-10-15 NOTE — ED Notes (Signed)
Attempted blood draw with no success.     

## 2015-10-15 NOTE — ED Notes (Signed)
Per pt, states she has been having abdominal pain and diarrhea for a week-complaining of left arm pain, sleeping a lot, multiple complaints

## 2015-10-15 NOTE — ED Provider Notes (Signed)
CSN: [id number]     Arrival date & time 10/15/15  1730 History   First MD Initiated Contact with Patient 10/15/15 2251     Chief Complaint  Patient presents with  . Abdominal Pain   HPI  Kelli Holland is a 24 year old female presenting with abdominal pain. Pt states that she has had epigastric pain for the past week and a half. The pain is described as aching and does not radiate. The pain is waxing and waning in intensity. It increases after eating. She endorses nausea, increased belching and diarrhea over the past week as well. She has not tried OTC symptom relievers. Also endorses recent increased fatigue and chills over the past couple of days. Denies history of GI problems including GERD or PUD. Denies fevers, chest pain, SOB, cough, vomiting, dysuria or vaginal discharge.   History reviewed. No pertinent past medical history. Past Surgical History  Procedure Laterality Date  . Eye surgery     Family History  Problem Relation Age of Onset  . Hypertension Father    Social History  Substance Use Topics  . Smoking status: Current Some Day Smoker -- 0.25 packs/day    Types: Cigarettes  . Smokeless tobacco: Never Used  . Alcohol Use: Yes     Comment: rare   OB History    No data available     Review of Systems  Constitutional: Positive for chills. Negative for fever and diaphoresis.  Respiratory: Negative for shortness of breath.   Cardiovascular: Negative for chest pain.  Gastrointestinal: Positive for nausea, abdominal pain and diarrhea. Negative for vomiting.  Genitourinary: Negative for dysuria and vaginal discharge.  Musculoskeletal: Negative for back pain.  Neurological: Negative for headaches.  All other systems reviewed and are negative.     Allergies  Other  Home Medications   Prior to Admission medications   Medication Sig Start Date End Date Taking? Authorizing Provider  Bismuth Subsalicylate (PEPTO-BISMOL PO) Take 1 tablet by mouth every 6 (six) hours.    Yes Historical Provider, MD  calcium carbonate (TUMS - DOSED IN MG ELEMENTAL CALCIUM) 500 MG chewable tablet Chew 1 tablet by mouth daily.   Yes Historical Provider, MD  EPINEPHrine (EPIPEN) 0.3 mg/0.3 mL IJ SOAJ injection Inject 0.3 mLs (0.3 mg total) into the muscle as needed. 06/18/14  Yes Pricilla LovelessScott Goldston, MD  ibuprofen (ADVIL,MOTRIN) 200 MG tablet Take 400 mg by mouth every 6 (six) hours as needed for mild pain.   Yes Historical Provider, MD  levonorgestrel (MIRENA) 20 MCG/24HR IUD 1 each by Intrauterine route once.   Yes Historical Provider, MD  diphenhydrAMINE (BENADRYL) 25 MG tablet Take 2 tablets (50 mg total) by mouth every 4 (four) hours as needed for itching or allergies. Patient not taking: Reported on 11/29/2014 06/18/14   Pricilla LovelessScott Goldston, MD  methocarbamol (ROBAXIN) 500 MG tablet Take 1 tablet (500 mg total) by mouth 2 (two) times daily. Patient not taking: Reported on 10/15/2015 07/11/15   Garlon HatchetLisa M Sanders, PA-C  ondansetron (ZOFRAN) 4 MG tablet Take 1 tablet (4 mg total) by mouth every 6 (six) hours. Take as needed for nausea/vomiting. 10/16/15   Antony MaduraKelly Humes, PA-C  oxyCODONE-acetaminophen (PERCOCET/ROXICET) 5-325 MG tablet Take 1-2 tablets by mouth every 6 (six) hours as needed for severe pain. 10/16/15   Antony MaduraKelly Humes, PA-C  traMADol (ULTRAM) 50 MG tablet Take 1 tablet (50 mg total) by mouth every 6 (six) hours as needed. Patient not taking: Reported on 10/15/2015 07/11/15   Garlon HatchetLisa M Sanders,  PA-C   BP 101/62 mmHg  Pulse 62  Temp(Src) 98.1 F (36.7 C) (Oral)  Resp 14  SpO2 100% Physical Exam  Constitutional: She appears well-developed and well-nourished. No distress.  HENT:  Head: Normocephalic and atraumatic.  Eyes: Conjunctivae are normal. Right eye exhibits no discharge. Left eye exhibits no discharge. No scleral icterus.  Neck: Normal range of motion.  Cardiovascular: Normal rate, regular rhythm and normal heart sounds.   Pulmonary/Chest: Effort normal and breath sounds normal. No  respiratory distress. She has no wheezes. She has no rales.  Abdominal: Soft. Bowel sounds are normal. There is tenderness (epigastric) in the epigastric area. There is no rigidity, no rebound, no guarding, no tenderness at McBurney's point and negative Murphy's sign.  Obese  Musculoskeletal: Normal range of motion.  Neurological: She is alert. Coordination normal.  Skin: Skin is warm and dry.  Psychiatric: She has a normal mood and affect. Her behavior is normal.  Nursing note and vitals reviewed.   ED Course  Procedures (including critical care time) Labs Review Labs Reviewed  CBC - Abnormal; Notable for the following:    WBC 10.6 (*)    All other components within normal limits  LIPASE, BLOOD  COMPREHENSIVE METABOLIC PANEL  URINALYSIS, ROUTINE W REFLEX MICROSCOPIC (NOT AT [facility])  POC URINE PREG, ED    Imaging Review US Abdomen Limited  10/16/2015  CLINICAL DATA:  Acute onset of epigastric abdominal pain and nausea for 1 week. Initial encounter. EXAM: US ABDOMEN LIMITED - RIGHT UPPER QUADRANT COMPARISON:  None. FINDINGS: Gallbladder: Multiple stones are seen within the gallbladder. The gallbladder is partially decompressed and otherwise unremarkable. No gallbladder wall thickening or pericholecystic fluid is seen. No ultrasonographic Murphy's sign is elicited. Common bile duct: Diameter: 0.3 cm, within normal limits in caliber Liver: No focal lesion identified. Within normal limits in parenchymal echogenicity. IMPRESSION: Cholelithiasis. Gallbladder otherwise unremarkable in appearance. No evidence for obstruction or cholecystitis. Electronically Signed   By: Roanna Raider M.D.   On: 10/16/2015 04:03   I have personally reviewed and evaluated these images and lab results as part of my medical decision-making.   EKG Interpretation None      MDM   Final diagnoses:  Biliary colic   Pt presenting with post-prandial epigastric pain x 1 week. Associated symptoms include chills,  fatigue, nausea, diarrhea and belching. Afebrile. Non-toxic appearing. Abdomen is soft with mild tenderness in the epigastric region. negative Murphy's. No rebound or guarding; no peritoneal signs to suggest surgical abdomen. WBC elevated to 10.6. Will get RUQ abdominal US to evaluate gallbladder function. 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 Pain controlled in ED with percocet. Nausea controlled with zofran. Pt care signed out to [facility], PA-C pending RUQ [place] results.     Alveta Heimlich, PA-C 10/16/15 1224  Blake Divine, MD 10/17/15 [phone]

## 2015-10-16 ENCOUNTER — Emergency Department (HOSPITAL_COMMUNITY): Payer: 59

## 2015-10-16 ENCOUNTER — Emergency Department (HOSPITAL_COMMUNITY): Payer: Self-pay

## 2015-10-16 LAB — URINALYSIS, ROUTINE W REFLEX MICROSCOPIC
BILIRUBIN URINE: NEGATIVE
GLUCOSE, UA: NEGATIVE mg/dL
HGB URINE DIPSTICK: NEGATIVE
Ketones, ur: NEGATIVE mg/dL
Leukocytes, UA: NEGATIVE
NITRITE: NEGATIVE
PH: 6 (ref 5.0–8.0)
Protein, ur: NEGATIVE mg/dL
SPECIFIC GRAVITY, URINE: 1.007 (ref 1.005–1.030)
Urobilinogen, UA: 0.2 mg/dL (ref 0.0–1.0)

## 2015-10-16 LAB — POC URINE PREG, ED: Preg Test, Ur: NEGATIVE

## 2015-10-16 MED ORDER — ONDANSETRON HCL 4 MG/2ML IJ SOLN
4.0000 mg | Freq: Once | INTRAMUSCULAR | Status: AC
  Administered 2015-10-16: 4 mg via INTRAVENOUS
  Filled 2015-10-16: qty 2

## 2015-10-16 MED ORDER — ONDANSETRON HCL 4 MG PO TABS: 4.0000 mg | ORAL_TABLET | Freq: Four times a day (QID) | ORAL | Status: DC

## 2015-10-16 MED ORDER — MORPHINE SULFATE (PF) 4 MG/ML IV SOLN
4.0000 mg | Freq: Once | INTRAVENOUS | Status: AC
  Administered 2015-10-16: 4 mg via INTRAVENOUS
  Filled 2015-10-16: qty 1

## 2015-10-16 MED ORDER — OXYCODONE-ACETAMINOPHEN 5-325 MG PO TABS
2.0000 | ORAL_TABLET | Freq: Once | ORAL | Status: AC
  Administered 2015-10-16: 2 via ORAL
  Filled 2015-10-16: qty 2

## 2015-10-16 MED ORDER — OXYCODONE-ACETAMINOPHEN 5-325 MG PO TABS: 1.0000 | ORAL_TABLET | Freq: Four times a day (QID) | ORAL | Status: DC | PRN

## 2015-10-16 NOTE — ED Provider Notes (Signed)
8119 - Patient care assumed from [facility], PA-C at change of shift. Patient pending ultrasound to evaluate source of right upper quadrant abdominal pain. Ultrasound findings reviewed which show cholelithiasis without evidence of cholecystitis. Patient with only a mildly elevated white count today. Liver function tests are normal. Pain was initially well controlled with Percocet. Patient reports some mild return in her pain since completion of her U/S. She has had no further nausea or vomiting. Additional pain medication ordered. Have discussed outpatient management with pain control and general surgery follow-up to discuss elective cholecystectomy. Patient agreeable to and comfortable with plan. Return precautions discussed and provided. Patient discharged in good condition with no unaddressed concerns.  Results for orders placed or performed during the hospital encounter of 10/15/15  Lipase, blood  Result Value Ref Range   Lipase 32 11 - 51 U/L  Comprehensive metabolic panel  Result Value Ref Range   Sodium 139 135 - 145 mmol/L   Potassium 3.8 3.5 - 5.1 mmol/L   Chloride 107 101 - 111 mmol/L   CO2 25 22 - 32 mmol/L   Glucose, Bld 99 65 - 99 mg/dL   BUN 11 6 - 20 mg/dL   Creatinine, Ser 1.47 0.44 - 1.00 mg/dL   Calcium 9.2 8.9 - 82.9 mg/dL   Total Protein 6.9 6.5 - 8.1 g/dL   Albumin 3.8 3.5 - 5.0 g/dL   AST 20 15 - 41 U/L   ALT 22 14 - 54 U/L   Alkaline Phosphatase 82 38 - 126 U/L   Total Bilirubin 0.5 0.3 - 1.2 mg/dL   GFR calc non Af Amer >60 >60 mL/min   GFR calc Af Amer >60 >60 mL/min   Anion gap 7 5 - 15  CBC  Result Value Ref Range   WBC 10.6 (H) 4.0 - 10.5 K/uL   RBC 4.64 3.87 - 5.11 MIL/uL   Hemoglobin 13.6 12.0 - 15.0 g/dL   HCT 56.2 13.0 - 86.5 %   MCV 87.7 78.0 - 100.0 fL   MCH 29.3 26.0 - 34.0 pg   MCHC 33.4 30.0 - 36.0 g/dL   RDW 78.4 69.6 - 29.5 %   Platelets 317 150 - 400 K/uL  Urinalysis, Routine w reflex microscopic (not at [facility])  Result Value Ref Range   Color, Urine YELLOW YELLOW   APPearance CLEAR CLEAR   Specific Gravity, Urine 1.007 1.005 - 1.030   pH 6.0 5.0 - 8.0   Glucose, UA NEGATIVE NEGATIVE mg/dL   Hgb urine dipstick NEGATIVE NEGATIVE   Bilirubin Urine NEGATIVE NEGATIVE   Ketones, ur NEGATIVE NEGATIVE mg/dL   Protein, ur NEGATIVE NEGATIVE mg/dL   Urobilinogen, UA 0.2 0.0 - 1.0 mg/dL   Nitrite NEGATIVE NEGATIVE   Leukocytes, UA NEGATIVE NEGATIVE  POC Urine Pregnancy, ED (do NOT order at [facility])  Result Value Ref Range   Preg Test, Ur NEGATIVE NEGATIVE   US Abdomen Limited  10/16/2015  CLINICAL DATA:  Acute onset of epigastric abdominal pain and nausea for 1 week. Initial encounter. EXAM: US ABDOMEN LIMITED - RIGHT UPPER QUADRANT COMPARISON:  None. FINDINGS: Gallbladder: Multiple stones are seen within the gallbladder. The gallbladder is partially decompressed and otherwise unremarkable. No gallbladder wall thickening or pericholecystic fluid is seen. No ultrasonographic Murphy's sign is elicited. Common bile duct: Diameter: 0.3 cm, within normal limits in caliber Liver: No focal lesion identified. Within normal limits in parenchymal echogenicity. IMPRESSION: Cholelithiasis. Gallbladder otherwise unremarkable in appearance. No evidence for obstruction or cholecystitis. Electronically Signed  By: Roanna RaiderJeffery  Chang M.D.   On: 10/16/2015 04:03      Antony MaduraKelly Yarelis Ambrosino, PA-C 10/16/15 0440  Loren Raceravid Yelverton, MD 10/17/15 [phone]

## 2015-10-16 NOTE — Discharge Instructions (Signed)
Biliary Colic °Biliary colic is a pain in the upper abdomen. The pain: °· Is usually felt on the right side of the abdomen, but it may also be felt in the center of the abdomen, just below the breastbone (sternum). °· May spread back toward the right shoulder blade. °· May be steady or irregular. °· May be accompanied by nausea and vomiting. °Most of the time, the pain goes away in 1-5 hours. After the most intense pain passes, the abdomen may continue to ache mildly for about 24 hours. °Biliary colic is caused by a blockage in the bile duct. The bile duct is a pathway that carries bile--a liquid that helps to digest fats--from the gallbladder to the small intestine. Biliary colic usually occurs after eating, when the digestive system demands bile. The pain develops when muscle cells contract forcefully to try to move the blockage so that bile can get by. °HOME CARE INSTRUCTIONS °· Take medicines only as directed by your health care provider. °· Drink enough fluid to keep your urine clear or pale yellow. °· Avoid fatty, greasy, and fried foods. These kinds of foods increase your body's demand for bile. °· Avoid any foods that make your pain worse. °· Avoid overeating. °· Avoid having a large meal after fasting. °SEEK MEDICAL CARE IF: °· You develop a fever. °· Your pain gets worse. °· You vomit. °· You develop nausea that prevents you from eating and drinking. °SEEK IMMEDIATE MEDICAL CARE IF: °· You suddenly develop a fever and shaking chills. °· You develop a yellowish discoloration (jaundice) of: °¨ Skin. °¨ Whites of the eyes. °¨ Mucous membranes. °· You have continuous or severe pain that is not relieved with medicines. °· You have nausea and vomiting that is not relieved with medicines. °· You develop dizziness or you faint. °  °This information is not intended to replace advice given to you by your health care provider. Make sure you discuss any questions you have with your health care provider. °  °Document  Released: 05/15/2006 Document Revised: 04/28/2015 Document Reviewed: 09/23/2014 °Elsevier Interactive Patient Education ©2016 Elsevier Inc. ° ° °Low-Fat Diet for Pancreatitis or Gallbladder Conditions °A low-fat diet can be helpful if you have pancreatitis or a gallbladder condition. With these conditions, your pancreas and gallbladder have trouble digesting fats. A healthy eating plan with less fat will help rest your pancreas and gallbladder and reduce your symptoms. °WHAT DO I NEED TO KNOW ABOUT THIS DIET? °· Eat a low-fat diet. °¨ Reduce your fat intake to less than 20-30% of your total daily calories. This is less than 50-60 g of fat per day. °¨ Remember that you need some fat in your diet. Ask your dietician what your daily goal should be. °¨ Choose nonfat and low-fat healthy foods. Look for the words "nonfat," "low fat," or "fat free." °¨ As a guide, look on the label and choose foods with less than 3 g of fat per serving. Eat only one serving. °· Avoid alcohol. °· Do not smoke. If you need help quitting, talk with your health care provider. °· Eat small frequent meals instead of three large heavy meals. °WHAT FOODS CAN I EAT? °Grains °Include healthy grains and starches such as potatoes, wheat bread, fiber-rich cereal, and brown rice. Choose whole grain options whenever possible. In adults, whole grains should account for 45-65% of your daily calories.  °Fruits and Vegetables °Eat plenty of fruits and vegetables. Fresh fruits and vegetables add fiber to your diet. °Meats and   Other Protein Sources °Eat lean meat such as chicken and pork. Trim any fat off of meat before cooking it. Eggs, fish, and beans are other sources of protein. In adults, these foods should account for 10-35% of your daily calories. °Dairy °Choose low-fat milk and dairy options. Dairy includes fat and protein, as well as calcium.  °Fats and Oils °Limit high-fat foods such as fried foods, sweets, baked goods, sugary drinks.  °Other °Creamy  sauces and condiments, such as mayonnaise, can add extra fat. Think about whether or not you need to use them, or use smaller amounts or low fat options. °WHAT FOODS ARE NOT RECOMMENDED? °· High fat foods, such as: °¨ Baked goods. °¨ Ice cream. °¨ French toast. °¨ Sweet rolls. °¨ Pizza. °¨ Cheese bread. °¨ Foods covered with batter, butter, creamy sauces, or cheese. °¨ Fried foods. °¨ Sugary drinks and desserts. °· Foods that cause gas or bloating °  °This information is not intended to replace advice given to you by your health care provider. Make sure you discuss any questions you have with your health care provider. °  °Document Released: 12/17/2013 Document Reviewed: 12/17/2013 °Elsevier Interactive Patient Education ©2016 Elsevier Inc. ° °

## 2016-04-03 ENCOUNTER — Ambulatory Visit (HOSPITAL_COMMUNITY)
Admission: EM | Admit: 2016-04-03 | Discharge: 2016-04-03 | Discharge status: Absent without leave | Payer: BLUE CROSS/BLUE SHIELD

## 2016-07-02 ENCOUNTER — Emergency Department (HOSPITAL_COMMUNITY): Payer: BLUE CROSS/BLUE SHIELD

## 2016-07-02 ENCOUNTER — Inpatient Hospital Stay (HOSPITAL_COMMUNITY)
Admission: EM | Admit: 2016-07-02 | Discharge: 2016-07-05 | DRG: 418 | Disposition: A | Discharge status: Home / Self Care | Payer: BLUE CROSS/BLUE SHIELD | Attending: Surgery | Admitting: Surgery

## 2016-07-02 ENCOUNTER — Encounter (HOSPITAL_COMMUNITY): Payer: Self-pay | Admitting: Emergency Medicine

## 2016-07-02 DIAGNOSIS — K819 Cholecystitis, unspecified: Secondary | ICD-10-CM | POA: Diagnosis present

## 2016-07-02 DIAGNOSIS — K8064 Calculus of gallbladder and bile duct with chronic cholecystitis without obstruction: Principal | ICD-10-CM | POA: Diagnosis present

## 2016-07-02 DIAGNOSIS — F419 Anxiety disorder, unspecified: Secondary | ICD-10-CM | POA: Diagnosis present

## 2016-07-02 DIAGNOSIS — Z8249 Family history of ischemic heart disease and other diseases of the circulatory system: Secondary | ICD-10-CM

## 2016-07-02 DIAGNOSIS — K8050 Calculus of bile duct without cholangitis or cholecystitis without obstruction: Secondary | ICD-10-CM | POA: Insufficient documentation

## 2016-07-02 DIAGNOSIS — Z6841 Body Mass Index (BMI) 40.0 and over, adult: Secondary | ICD-10-CM

## 2016-07-02 DIAGNOSIS — F17210 Nicotine dependence, cigarettes, uncomplicated: Secondary | ICD-10-CM | POA: Diagnosis present

## 2016-07-02 DIAGNOSIS — Z419 Encounter for procedure for purposes other than remedying health state, unspecified: Secondary | ICD-10-CM

## 2016-07-02 HISTORY — DX: Calculus of gallbladder without cholecystitis without obstruction: K80.20

## 2016-07-02 LAB — URINALYSIS, ROUTINE W REFLEX MICROSCOPIC
Bilirubin Urine: NEGATIVE
GLUCOSE, UA: NEGATIVE mg/dL
HGB URINE DIPSTICK: NEGATIVE
KETONES UR: NEGATIVE mg/dL
Leukocytes, UA: NEGATIVE
Nitrite: NEGATIVE
PH: 5 (ref 5.0–8.0)
PROTEIN: NEGATIVE mg/dL
Specific Gravity, Urine: 1.026 (ref 1.005–1.030)

## 2016-07-02 LAB — CBC
HEMATOCRIT: 43.1 % (ref 36.0–46.0)
Hemoglobin: 14.8 g/dL (ref 12.0–15.0)
MCH: 29.4 pg (ref 26.0–34.0)
MCHC: 34.3 g/dL (ref 30.0–36.0)
MCV: 85.7 fL (ref 78.0–100.0)
PLATELETS: 323 10*3/uL (ref 150–400)
RBC: 5.03 MIL/uL (ref 3.87–5.11)
RDW: 12.8 % (ref 11.5–15.5)
WBC: 10.3 10*3/uL (ref 4.0–10.5)

## 2016-07-02 LAB — COMPREHENSIVE METABOLIC PANEL
ALBUMIN: 4.2 g/dL (ref 3.5–5.0)
ALT: 21 U/L (ref 14–54)
AST: 17 U/L (ref 15–41)
Alkaline Phosphatase: 85 U/L (ref 38–126)
Anion gap: 7 (ref 5–15)
BUN: 13 mg/dL (ref 6–20)
CHLORIDE: 105 mmol/L (ref 101–111)
CO2: 24 mmol/L (ref 22–32)
CREATININE: 0.72 mg/dL (ref 0.44–1.00)
Calcium: 9.2 mg/dL (ref 8.9–10.3)
GFR calc Af Amer: >60 mL/min (ref >60)
GLUCOSE: 93 mg/dL (ref 65–99)
Potassium: 4 mmol/L (ref 3.5–5.1)
Sodium: 136 mmol/L (ref 135–145)
Total Bilirubin: 0.4 mg/dL (ref 0.3–1.2)
Total Protein: 7.7 g/dL (ref 6.5–8.1)

## 2016-07-02 LAB — LIPASE, BLOOD: LIPASE: 32 U/L (ref 11–51)

## 2016-07-02 LAB — POC URINE PREG, ED: Preg Test, Ur: NEGATIVE

## 2016-07-02 MED ORDER — IBUPROFEN 200 MG PO TABS: 600.0000 mg | ORAL_TABLET | Freq: Four times a day (QID) | ORAL | Status: DC | PRN

## 2016-07-02 MED ORDER — CEFTRIAXONE SODIUM 2 G IJ SOLR
2.0000 g | Freq: Every day | INTRAMUSCULAR | Status: DC
  Administered 2016-07-03 – 2016-07-04 (×3): 2 g via INTRAVENOUS
  Filled 2016-07-02 (×4): qty 2

## 2016-07-02 MED ORDER — ONDANSETRON HCL 4 MG/2ML IJ SOLN
4.0000 mg | Freq: Four times a day (QID) | INTRAMUSCULAR | Status: DC | PRN
  Administered 2016-07-03 – 2016-07-04 (×2): 4 mg via INTRAVENOUS

## 2016-07-02 MED ORDER — ONDANSETRON 4 MG PO TBDP
4.0000 mg | ORAL_TABLET | Freq: Four times a day (QID) | ORAL | Status: DC | PRN
  Administered 2016-07-05: 4 mg via ORAL
  Filled 2016-07-02: qty 1

## 2016-07-02 MED ORDER — SODIUM CHLORIDE 0.9 % IV BOLUS (SEPSIS)
1000.0000 mL | Freq: Once | INTRAVENOUS | Status: AC
  Administered 2016-07-02: 1000 mL via INTRAVENOUS

## 2016-07-02 MED ORDER — HYDROMORPHONE HCL 1 MG/ML IJ SOLN
1.0000 mg | Freq: Once | INTRAMUSCULAR | Status: AC
Start: 2016-07-02 — End: 2016-07-02
  Administered 2016-07-02: 1 mg via INTRAVENOUS
  Filled 2016-07-02: qty 1

## 2016-07-02 MED ORDER — ONDANSETRON HCL 4 MG/2ML IJ SOLN
4.0000 mg | Freq: Once | INTRAMUSCULAR | Status: AC
Start: 2016-07-02 — End: 2016-07-02
  Administered 2016-07-02: 4 mg via INTRAVENOUS
  Filled 2016-07-02: qty 2

## 2016-07-02 MED ORDER — HYDROCODONE-ACETAMINOPHEN 5-325 MG PO TABS
1.0000 | ORAL_TABLET | ORAL | Status: DC | PRN
  Administered 2016-07-03 (×3): 2 via ORAL
  Filled 2016-07-02 (×3): qty 2

## 2016-07-02 MED ORDER — MORPHINE SULFATE (PF) 2 MG/ML IV SOLN
1.0000 mg | INTRAVENOUS | Status: DC | PRN
  Administered 2016-07-03 – 2016-07-04 (×12): 2 mg via INTRAVENOUS
  Filled 2016-07-02 (×14): qty 1

## 2016-07-02 MED ORDER — FENTANYL CITRATE (PF) 100 MCG/2ML IJ SOLN
50.0000 ug | Freq: Once | INTRAMUSCULAR | Status: AC
  Administered 2016-07-02: 50 ug via INTRAVENOUS
  Filled 2016-07-02: qty 2

## 2016-07-02 MED ORDER — KCL IN DEXTROSE-NACL 20-5-0.45 MEQ/L-%-% IV SOLN
INTRAVENOUS | Status: DC
  Administered 2016-07-03 (×2): via INTRAVENOUS
  Filled 2016-07-02 (×4): qty 1000

## 2016-07-02 NOTE — H&P (Addendum)
Re:   Kelli Holland DOB:   April 07, 1991 MRN:   [id number]   [facility] admission  ASSESSMENT AND PLAN: 1.  Cholecystitis, gall stones  Plan:  Admit to hospital, keep NPO past MN, plan cholecystectomy in AM  I discussed with the patient the indications and risks of gall bladder surgery.  The primary risks of gall bladder surgery include, but are not limited to, bleeding, infection, common bile duct injury, and open surgery.  There is also the risk that the patient may have continued symptoms after surgery.  We discussed the typical post-operative recovery course. I tried to answer the patient's questions.  I gave the patient literature about gall bladder surgery.  2.  Very poor dentition 3.  Smokes - 1/2 ppd (she originally told me 2 to 3 cigarettes per day)  Chief Complaint  Patient presents with  . Abdominal Pain  . Diarrhea   REFERRING PHYSICIAN: REDMON,Kelli Holland  HISTORY OF PRESENT ILLNESS: Kelli Holland is a 25 y.o. (DOB: April 07, 1991)  white  female whose primary care physician is Kelli LanPenny Jones, NP, [place], [facility]. Her boyfried, Kelli Holland, is in the room with her.  She has had abdominal pain and diarrhea for about 10 days.  The abdominal pain has become significantly worse over the last day.  She has not been jaundiced.   She has knows gallstones.  But when she found out about the gall stones last year, she did not have insurance.  The abdominal pain is in her RUQ and towards her back.  US of abdomen - 07/02/2016 - gall packed with stones (US in 10/16/2015 showed multiple gall stones) WBC - 10,600 - 07/02/2016  Past Medical History  Diagnosis Date  . Gallstones      Past Surgical History  Procedure Laterality Date  . Eye surgery        No current facility-administered medications for this encounter.   Current Outpatient Prescriptions  Medication Sig Dispense Refill  . bismuth subsalicylate (PEPTO BISMOL) 262 MG chewable tablet Chew 524 mg  by mouth as needed for indigestion.    . calcium carbonate (TUMS - DOSED IN MG ELEMENTAL CALCIUM) 500 MG chewable tablet Chew 2 tablets by mouth daily as needed for indigestion.     . cetirizine (ZYRTEC) 10 MG tablet Take 10 mg by mouth daily as needed for allergies.    . diphenhydrAMINE (BENADRYL) 25 MG tablet Take 2 tablets (50 mg total) by mouth every 4 (four) hours as needed for itching or allergies. 20 tablet 0  . EPINEPHrine (EPIPEN) 0.3 mg/0.3 mL IJ SOAJ injection Inject 0.3 mLs (0.3 mg total) into the muscle as needed. 1 Device 1  . ibuprofen (ADVIL,MOTRIN) 200 MG tablet Take 400 mg by mouth every 6 (six) hours as needed for mild pain.    Marland Kitchen. levonorgestrel (MIRENA) 20 MCG/24HR IUD 1 each by Intrauterine route once.    . pseudoephedrine (SUDAFED) 120 MG 12 hr tablet Take 120 mg by mouth 2 (two) times daily as needed for congestion.    . ranitidine (ZANTAC) 150 MG tablet Take 150 mg by mouth 2 (two) times daily as needed for heartburn.        Allergies  Allergen Reactions  . Other Anaphylaxis and Hives    Horseradish     REVIEW OF SYSTEMS: Skin:  No history of rash.  No history of abnormal moles. Infection:  Very poor dentition Neurologic:  No history of stroke.  No history  of seizure.  No history of headaches. Cardiac:  No history of hypertension. No history of heart disease.  No history of prior cardiac catheterization.  No history of seeing a cardiologist. Pulmonary:  1/2 ppd (she originally told me 2 to 3 cigarettes per day)  Endocrine:  No diabetes. No thyroid disease. Gastrointestinal:  No history of stomach disease.  No history of liver disease.  No history of gall bladder disease.  No history of pancreas disease.  No history of colon disease. Urologic:  No history of kidney stones.  No history of bladder infections. Musculoskeletal:  No history of joint or back disease. Hematologic:  No bleeding disorder.  No history of anemia.  Not anticoagulated. Psycho-social:  The  patient is oriented.   The patient has no obvious psychologic or social impairment to understanding our conversation and plan.  SOCIAL and FAMILY HISTORY: Unmarried. Boyfriend, Kelli Holland, with her. Her parents live in [place]. She works for  [employer], [employer], at [employer].  PHYSICAL EXAM: BP 102/70 mmHg  Pulse 66  Temp(Src) 97.6 F (36.4 C) (Oral)  Resp 18  Ht  (1.6 m)  Wt 113.399 kg (250 lb)  BMI 44.30 kg/m2  SpO2 99%  General: Obese WF who is alert. She looks a little miserable. HEENT: Normal. Pupils equal.  Very poor dentition. Neck: Supple. No mass.  No thyroid mass. Lymph Nodes:  No supraclavicular or cervical nodes. Lungs: Clear to auscultation and symmetric breath sounds. Heart:  RRR. No murmur or rub. Abdomen: Soft.  No abdominal scars.  Decrease bowel sounds, sore in the RUQ, no peritoneal signs.  Extremities:  Good strength and ROM  in upper and lower extremities. Neurologic:  Grossly intact to motor and sensory function. Psychiatric: Has normal mood and affect. Behavior is normal.   DATA REVIEWED: Epic notes  Ovidio Kin, MD,  [facility] [address] [place]  [address]   [place], [place]    [address] Phone:  [phone] FAX:  [phone]

## 2016-07-02 NOTE — ED Notes (Signed)
Pt states she has had abdominal pain and diarrhea for a week and a half. Pt states that she has a hx gallstones. Pt's pain is in her upper right side of her abdomen and her back. Pt states that she has been "peeing less" but denies pain with urination or malodorous urine. Pt states she also has nausea with 1 episode of emesis. Pt states she has had 3 episodes of diarrhea today

## 2016-07-02 NOTE — ED Provider Notes (Signed)
CSN: [id number]     Arrival date & time 07/02/16  1707 History   First MD Initiated Contact with Patient 07/02/16 1750     Chief Complaint  Patient presents with  . Abdominal Pain  . Diarrhea     (Consider location/radiation/quality/duration/timing/severity/associated sxs/prior Treatment) HPI Patient presents to the emergency department withRight upper abdominal pain that started yesterday.  The patient, states she has had pain similar to this in the past.  She is told she had gallstones, but has not followed up for this.  The patient denies taking any medications prior to arrival.  States nothing seems make the condition better, but palpation makes the pain worse. The patient denies chest pain, shortness of breath, headache,blurred vision, neck pain, fever, cough, weakness, numbness, dizziness, anorexia, edema, vomiting, diarrhea, rash, back pain, dysuria, hematemesis, bloody stool, near syncope, or syncope. Past Medical History  Diagnosis Date  . Gallstones    Past Surgical History  Procedure Laterality Date  . Eye surgery     Family History  Problem Relation Age of Onset  . Hypertension Father    Social History  Substance Use Topics  . Smoking status: Current Some Day Smoker -- 0.25 packs/day    Types: Cigarettes  . Smokeless tobacco: Never Used  . Alcohol Use: Yes     Comment: rare   OB History    No data available     Review of Systems  All other systems negative except as documented in the HPI. All pertinent positives and negatives as reviewed in the HPI.  Allergies  Other  Home Medications   Prior to Admission medications   Medication Sig Start Date End Date Taking? Authorizing Provider  bismuth subsalicylate (PEPTO BISMOL) 262 MG chewable tablet Chew 524 mg by mouth as needed for indigestion.   Yes Historical Provider, MD  calcium carbonate (TUMS - DOSED IN MG ELEMENTAL CALCIUM) 500 MG chewable tablet Chew 2 tablets by mouth daily as needed for indigestion.     Yes Historical Provider, MD  cetirizine (ZYRTEC) 10 MG tablet Take 10 mg by mouth daily as needed for allergies.   Yes Historical Provider, MD  diphenhydrAMINE (BENADRYL) 25 MG tablet Take 2 tablets (50 mg total) by mouth every 4 (four) hours as needed for itching or allergies. 06/18/14  Yes Pricilla Loveless, MD  EPINEPHrine (EPIPEN) 0.3 mg/0.3 mL IJ SOAJ injection Inject 0.3 mLs (0.3 mg total) into the muscle as needed. 06/18/14  Yes Pricilla Loveless, MD  ibuprofen (ADVIL,MOTRIN) 200 MG tablet Take 400 mg by mouth every 6 (six) hours as needed for mild pain.   Yes Historical Provider, MD  levonorgestrel (MIRENA) 20 MCG/24HR IUD 1 each by Intrauterine route once.   Yes Historical Provider, MD  pseudoephedrine (SUDAFED) 120 MG 12 hr tablet Take 120 mg by mouth 2 (two) times daily as needed for congestion.   Yes Historical Provider, MD  ranitidine (ZANTAC) 150 MG tablet Take 150 mg by mouth 2 (two) times daily as needed for heartburn.   Yes Historical Provider, MD   BP 129/80 mmHg  Pulse 109  Temp(Src) 97.6 F (36.4 C) (Oral)  Resp 16  Ht  (1.6 m)  Wt 113.399 kg  BMI 44.30 kg/m2  SpO2 96% Physical Exam  Constitutional: She is oriented to person, place, and time. She appears well-developed and well-nourished. No distress.  HENT:  Head: Normocephalic and atraumatic.  Mouth/Throat: Oropharynx is clear and moist.  Eyes: Pupils are equal, round, and reactive to light.  Neck:  Normal range of motion. Neck supple.  Cardiovascular: Normal rate, regular rhythm and normal heart sounds.  Exam reveals no gallop and no friction rub.   No murmur heard. Pulmonary/Chest: Effort normal and breath sounds normal. No respiratory distress. She has no wheezes.  Abdominal: Soft. Normal appearance and bowel sounds are normal. She exhibits no distension. There is tenderness. There is no rebound and no guarding.    Neurological: She is alert and oriented to person, place, and time. She exhibits normal muscle tone.  Coordination normal.  Skin: Skin is warm and dry. No rash noted. No erythema.  Psychiatric: She has a normal mood and affect. Her behavior is normal.  Nursing note and vitals reviewed.   ED Course  Procedures (including critical care time) Labs Review Labs Reviewed  LIPASE, BLOOD  COMPREHENSIVE METABOLIC PANEL  CBC  URINALYSIS, ROUTINE W REFLEX MICROSCOPIC (NOT [facility])  POC URINE PREG, ED    Imaging Review [place] Abdomen Limited  07/02/2016  CLINICAL DATA:  Right upper quadrant pain.  History of gallstones. EXAM: US ABDOMEN LIMITED - RIGHT UPPER QUADRANT COMPARISON:  Right upper quadrant ultrasound 10/16/2015 FINDINGS: Gallbladder: Gallbladder filled with shadowing stones creating a wall echo shadow complex. Normal wall thickness where visualized measuring 1.6 mm. No pericholecystic fluid. No sonographic Murphy sign noted by sonographer. Common bile duct: Diameter: 3.1 mm Liver: No focal lesion identified. Heterogeneous in parenchymal echogenicity, increased compared to right kidney. Normal directional flow in the main portal vein. IMPRESSION: 1. Cholelithiasis without gallbladder wall thickening or findings of acute cholecystitis. No biliary dilatation. 2. Borderline mild hepatic steatosis. Electronically Signed   By: Rubye OaksMelanie  Ehinger M.D.   On: 07/02/2016 18:56   I have personally reviewed and evaluated these images and lab results as part of my medical decision-making.   EKG Interpretation None      MDM   Final diagnoses:  None   I spoke with general surgery about the patient who will come to evaluate her for gallbladder related issues.  The patient is advised plan and all questions were answered    Charlestine NightChristopher Earnie Rockhold, PA-C 07/06/16 1332  Benjiman CoreNathan Pickering, MD 07/07/16 [phone]

## 2016-07-02 NOTE — ED Notes (Signed)
Pt is requesting more pain mediciation

## 2016-07-03 ENCOUNTER — Encounter (HOSPITAL_COMMUNITY): Admission: EM | Disposition: A | Discharge status: Home / Self Care | Payer: Self-pay | Source: Home / Self Care

## 2016-07-03 ENCOUNTER — Inpatient Hospital Stay (HOSPITAL_COMMUNITY): Payer: BLUE CROSS/BLUE SHIELD | Admitting: Certified Registered Nurse Anesthetist

## 2016-07-03 ENCOUNTER — Encounter (HOSPITAL_COMMUNITY): Payer: Self-pay | Admitting: *Deleted

## 2016-07-03 ENCOUNTER — Inpatient Hospital Stay (HOSPITAL_COMMUNITY): Payer: BLUE CROSS/BLUE SHIELD

## 2016-07-03 DIAGNOSIS — K8050 Calculus of bile duct without cholangitis or cholecystitis without obstruction: Secondary | ICD-10-CM

## 2016-07-03 HISTORY — PX: CHOLECYSTECTOMY: SHX55

## 2016-07-03 LAB — GLUCOSE, CAPILLARY: GLUCOSE-CAPILLARY: 107 mg/dL — AB (ref 65–99)

## 2016-07-03 SURGERY — LAPAROSCOPIC CHOLECYSTECTOMY WITH INTRAOPERATIVE CHOLANGIOGRAM
Anesthesia: General | Site: Abdomen

## 2016-07-03 MED ORDER — LIDOCAINE HCL (CARDIAC) 20 MG/ML IV SOLN
INTRAVENOUS | Status: AC
  Filled 2016-07-03: qty 5

## 2016-07-03 MED ORDER — BUPIVACAINE-EPINEPHRINE 0.25% -1:200000 IJ SOLN
INTRAMUSCULAR | Status: DC | PRN
  Administered 2016-07-03: 30 mL

## 2016-07-03 MED ORDER — MIDAZOLAM HCL 2 MG/2ML IJ SOLN
INTRAMUSCULAR | Status: AC
  Filled 2016-07-03: qty 2

## 2016-07-03 MED ORDER — PROMETHAZINE HCL 25 MG/ML IJ SOLN
6.2500 mg | INTRAMUSCULAR | Status: DC | PRN
  Administered 2016-07-03: 12.5 mg via INTRAVENOUS

## 2016-07-03 MED ORDER — GLYCOPYRROLATE 0.2 MG/ML IJ SOLN
INTRAMUSCULAR | Status: DC | PRN
  Administered 2016-07-03: 0.2 mg via INTRAVENOUS

## 2016-07-03 MED ORDER — LACTATED RINGERS IV SOLN
INTRAVENOUS | Status: DC | PRN
  Administered 2016-07-03 (×2): via INTRAVENOUS

## 2016-07-03 MED ORDER — PROMETHAZINE HCL 25 MG/ML IJ SOLN
INTRAMUSCULAR | Status: AC
  Filled 2016-07-03: qty 1

## 2016-07-03 MED ORDER — 0.9 % SODIUM CHLORIDE (POUR BTL) OPTIME
TOPICAL | Status: DC | PRN
  Administered 2016-07-03: 1000 mL

## 2016-07-03 MED ORDER — LACTATED RINGERS IR SOLN
Status: DC | PRN
  Administered 2016-07-03: 1000 mL

## 2016-07-03 MED ORDER — ROCURONIUM BROMIDE 100 MG/10ML IV SOLN
INTRAVENOUS | Status: DC | PRN
  Administered 2016-07-03: 50 mg via INTRAVENOUS

## 2016-07-03 MED ORDER — LIDOCAINE HCL (CARDIAC) 20 MG/ML IV SOLN
INTRAVENOUS | Status: DC | PRN
  Administered 2016-07-03: 80 mg via INTRAVENOUS

## 2016-07-03 MED ORDER — HYDROMORPHONE HCL 1 MG/ML IJ SOLN
INTRAMUSCULAR | Status: AC
  Filled 2016-07-03: qty 1

## 2016-07-03 MED ORDER — DEXAMETHASONE SODIUM PHOSPHATE 4 MG/ML IJ SOLN
INTRAMUSCULAR | Status: DC | PRN
  Administered 2016-07-03: 4 mg via INTRAVENOUS

## 2016-07-03 MED ORDER — FENTANYL CITRATE (PF) 250 MCG/5ML IJ SOLN
INTRAMUSCULAR | Status: AC
  Filled 2016-07-03: qty 5

## 2016-07-03 MED ORDER — AMPICILLIN-SULBACTAM SODIUM 1.5 (1-0.5) G IJ SOLR
1.5000 g | INTRAMUSCULAR | Status: AC
  Administered 2016-07-04: 1.5 g via INTRAVENOUS
  Filled 2016-07-03 (×2): qty 1.5

## 2016-07-03 MED ORDER — SODIUM CHLORIDE 0.9 % IJ SOLN
INTRAMUSCULAR | Status: AC
  Filled 2016-07-03: qty 10

## 2016-07-03 MED ORDER — HYDROMORPHONE HCL 1 MG/ML IJ SOLN
0.2500 mg | INTRAMUSCULAR | Status: DC | PRN
  Administered 2016-07-03 (×4): 0.5 mg via INTRAVENOUS

## 2016-07-03 MED ORDER — INDOMETHACIN 50 MG RE SUPP: 100.0000 mg | RECTAL | Status: DC

## 2016-07-03 MED ORDER — PROPOFOL 10 MG/ML IV BOLUS
INTRAVENOUS | Status: AC
  Filled 2016-07-03: qty 40

## 2016-07-03 MED ORDER — BUPIVACAINE-EPINEPHRINE (PF) 0.25% -1:200000 IJ SOLN
INTRAMUSCULAR | Status: AC
  Filled 2016-07-03: qty 30

## 2016-07-03 MED ORDER — ONDANSETRON HCL 4 MG/2ML IJ SOLN
INTRAMUSCULAR | Status: AC
  Filled 2016-07-03: qty 2

## 2016-07-03 MED ORDER — IOPAMIDOL (ISOVUE-300) INJECTION 61%
INTRAVENOUS | Status: AC
  Filled 2016-07-03: qty 50

## 2016-07-03 MED ORDER — FENTANYL CITRATE (PF) 100 MCG/2ML IJ SOLN
INTRAMUSCULAR | Status: DC | PRN
  Administered 2016-07-03 (×2): 100 ug via INTRAVENOUS
  Administered 2016-07-03: 50 ug via INTRAVENOUS

## 2016-07-03 MED ORDER — MIDAZOLAM HCL 5 MG/5ML IJ SOLN
INTRAMUSCULAR | Status: DC | PRN
  Administered 2016-07-03: 2 mg via INTRAVENOUS

## 2016-07-03 MED ORDER — KCL IN DEXTROSE-NACL 20-5-0.45 MEQ/L-%-% IV SOLN
INTRAVENOUS | Status: DC
  Administered 2016-07-03 – 2016-07-05 (×5): via INTRAVENOUS
  Filled 2016-07-03 (×7): qty 1000

## 2016-07-03 MED ORDER — MEPERIDINE HCL 50 MG/ML IJ SOLN: 12.5000 mg | INTRAMUSCULAR | Status: DC | PRN

## 2016-07-03 MED ORDER — SUGAMMADEX SODIUM 200 MG/2ML IV SOLN
INTRAVENOUS | Status: DC | PRN
  Administered 2016-07-03: 200 mg via INTRAVENOUS

## 2016-07-03 MED ORDER — PROPOFOL 10 MG/ML IV BOLUS
INTRAVENOUS | Status: DC | PRN
  Administered 2016-07-03: 200 mg via INTRAVENOUS

## 2016-07-03 MED ORDER — NICOTINE 14 MG/24HR TD PT24
14.0000 mg | MEDICATED_PATCH | Freq: Every day | TRANSDERMAL | Status: DC
  Administered 2016-07-03 – 2016-07-04 (×3): 14 mg via TRANSDERMAL
  Filled 2016-07-03 (×3): qty 1

## 2016-07-03 MED ORDER — SODIUM CHLORIDE 0.9 % IV SOLN
INTRAVENOUS | Status: DC | PRN
  Administered 2016-07-03: 20 mL

## 2016-07-03 SURGICAL SUPPLY — 36 items
APPLIER CLIP 5 13 M/L LIGAMAX5 (MISCELLANEOUS)
APPLIER CLIP ROT 10 11.4 M/L (STAPLE)
BENZOIN TINCTURE PRP APPL 2/3 (GAUZE/BANDAGES/DRESSINGS) ×3 IMPLANT
CABLE HIGH FREQUENCY MONO STRZ (ELECTRODE) ×3 IMPLANT
CHLORAPREP W/TINT 26ML (MISCELLANEOUS) ×3 IMPLANT
CHOLANGIOGRAM CATH TAUT (CATHETERS) ×3 IMPLANT
CLIP APPLIE 5 13 M/L LIGAMAX5 (MISCELLANEOUS) IMPLANT
CLIP APPLIE ROT 10 11.4 M/L (STAPLE) IMPLANT
CLOSURE WOUND 1/4X4 (GAUZE/BANDAGES/DRESSINGS) ×1
COVER MAYO STAND STRL (DRAPES) ×3 IMPLANT
COVER SURGICAL LIGHT HANDLE (MISCELLANEOUS) ×3 IMPLANT
DECANTER SPIKE VIAL GLASS SM (MISCELLANEOUS) ×3 IMPLANT
DRAPE C-ARM 42X120 X-RAY (DRAPES) ×3 IMPLANT
ELECT REM PT RETURN 9FT ADLT (ELECTROSURGICAL) ×3
ELECTRODE REM PT RTRN 9FT ADLT (ELECTROSURGICAL) ×1 IMPLANT
GLOVE SURG SIGNA 7.5 PF LTX (GLOVE) ×6 IMPLANT
GOWN STRL REUS W/TWL XL LVL3 (GOWN DISPOSABLE) ×9 IMPLANT
HEMOSTAT SURGICEL 4X8 (HEMOSTASIS) IMPLANT
IV CATH 14GX2 1/4 (CATHETERS) ×3 IMPLANT
IV SET EXTENSION CATH 6 NF (IV SETS) ×3 IMPLANT
KIT BASIN OR (CUSTOM PROCEDURE TRAY) ×3 IMPLANT
LIQUID BAND (GAUZE/BANDAGES/DRESSINGS) ×3 IMPLANT
POUCH SPECIMEN RETRIEVAL 10MM (ENDOMECHANICALS) ×3 IMPLANT
SCISSORS LAP 5X35 DISP (ENDOMECHANICALS) ×3 IMPLANT
SET IRRIG TUBING LAPAROSCOPIC (IRRIGATION / IRRIGATOR) ×3 IMPLANT
SLEEVE ADV FIXATION 5X100MM (TROCAR) ×3 IMPLANT
STOPCOCK 4 WAY LG BORE MALE ST (IV SETS) ×3 IMPLANT
STRIP CLOSURE SKIN 1/4X4 (GAUZE/BANDAGES/DRESSINGS) ×2 IMPLANT
SUT MNCRL AB 4-0 PS2 18 (SUTURE) ×3 IMPLANT
SYR 10ML ECCENTRIC (SYRINGE) ×3 IMPLANT
TOWEL OR 17X26 10 PK STRL BLUE (TOWEL DISPOSABLE) ×3 IMPLANT
TRAY LAPAROSCOPIC (CUSTOM PROCEDURE TRAY) ×3 IMPLANT
TROCAR ADV FIXATION 11X100MM (TROCAR) ×3 IMPLANT
TROCAR ADV FIXATION 5X100MM (TROCAR) ×3 IMPLANT
TROCAR XCEL BLUNT TIP 100MML (ENDOMECHANICALS) ×3 IMPLANT
TUBING INSUF HEATED (TUBING) ×3 IMPLANT

## 2016-07-03 NOTE — Transfer of Care (Signed)
Immediate Anesthesia Transfer of Care Note  Patient: Kelli BurySuzanna Holland  Procedure(s) Performed: Procedure(s): LAPAROSCOPIC CHOLECYSTECTOMY WITH INTRAOPERATIVE CHOLANGIOGRAM (N/A)  Patient Location: PACU  Anesthesia Type:General  Level of Consciousness: awake, alert  and oriented  Airway & Oxygen Therapy: Patient Spontanous Breathing and Patient connected to face mask oxygen  Post-op Assessment: Report given to RN and Post -op Vital signs reviewed and stable  Post vital signs: Reviewed and stable  Last Vitals:  Filed Vitals:   07/02/16 2325 07/03/16 0638  BP: 128/67 119/68  Pulse: 77 60  Temp: 37 C 36.6 C  Resp: 16 16    Last Pain:  Filed Vitals:   07/03/16 0639  PainSc: 9          Complications: No apparent anesthesia complications

## 2016-07-03 NOTE — Consult Note (Signed)
REQUESTING PROVIDER: Dr. David Newman, [facility] REASON FOR CONSULTATION: Choledocholithiasis post laparoscopic cholecystectomy   HISTORY OF PRESENT ILLNESS:  Kelli Holland is a 25 y.o. obese female with no significant past medical or past surgical history was had recent problems with biliary colic. Underwent laparoscopic cholecystectomy earlier today. Intraoperative cholangiogram demonstrates choledocholithiasis without obstruction. Preoperative liver tests and lipase normal. The patient is in her room with family members. Complains of mild incisional pain only. I have personally reviewed laboratory data and x-rays.  REVIEW OF SYSTEMS:  All non-GI ROS negative except for  Past Medical History  Diagnosis Date  . Gallstones     Past Surgical History  Procedure Laterality Date  . Eye surgery      Social History Kelli Holland  reports that she has been smoking Cigarettes.  She has been smoking about 0.25 packs per day. She has never used smokeless tobacco. She reports that she drinks alcohol. She reports that she does not use illicit drugs.. Works [employer] FAMILY HISTORY family history includes Hypertension in her father.  Allergies  Allergen Reactions  . Other Anaphylaxis and Hives    Horseradish       PHYSICAL EXAMINATION: Vital signs: BP 136/73 mmHg  Pulse 60  Temp(Src) 97.6 F (36.4 C) (Oral)  Resp 17  Ht 5' 3" (1.6 m)  Wt 250 lb (113.399 kg)  BMI 44.30 kg/m2  SpO2 100%  Constitutional: Obese, generally well-appearing, no acute distress Psychiatric: alert and oriented x3, cooperative Eyes: extraocular movements intact, anicteric, conjunctiva pink Mouth: oral pharynx moist, no lesions. Poor dentition Neck: supple no lymphadenopathy Cardiovascular: heart regular rate and rhythm, no murmur Lungs: clear to auscultation bilaterally Abdomen: soft, obese, incisional tenderness but otherwise nontender, nondistended, no obvious ascites, no peritoneal  signs, normal bowel sounds, no organomegaly Rectal: Omitted Extremities: no clubbing cyanosis or lower extremity edema bilaterally Skin: no lesions on visible extremities Neuro: No focal deficits.   ASSESSMENT:  1. Nonobstructive Choledocholithiasis post cholecystectomy 2. Morbid obesity   PLAN:  1. ERCP with possible stricture and stone extraction. This will be set up with Dr. Danis tomorrow morning.The nature of the procedure, as well as the risks (failed cannulation, pancreatitis, perforation, bleeding, infection, allergic reaction) sees, benefits, and alternatives were carefully and thoroughly reviewed with the patient. Ample time for discussion and questions allowed. The patient understood, was satisfied, and agreed to proceed.  Kelli Holland N. Jaimon Bugaj, Jr., M.D. [facility]     

## 2016-07-03 NOTE — Anesthesia Procedure Notes (Signed)
Procedure Name: Intubation Performed by: Kizzie FantasiaARVER, Ousmane Seeman J Pre-anesthesia Checklist: Patient identified, Emergency Drugs available, Suction available, Patient being monitored and Timeout performed Patient Re-evaluated:Patient Re-evaluated prior to inductionOxygen Delivery Method: Circle system utilized Preoxygenation: Pre-oxygenation with 100% oxygen Intubation Type: IV induction Ventilation: Mask ventilation without difficulty Laryngoscope Size: Mac and 4 Grade View: Grade I Tube type: Oral Number of attempts: 2 Airway Equipment and Method: Stylet Placement Confirmation: ETT inserted through vocal cords under direct vision,  positive ETCO2,  CO2 detector and breath sounds checked- equal and bilateral Secured at: 22 cm Tube secured with: Tape Dental Injury: Teeth and Oropharynx as per pre-operative assessment

## 2016-07-03 NOTE — Anesthesia Preprocedure Evaluation (Addendum)
Anesthesia Evaluation  Patient identified by MRN, date of birth, ID band Patient awake    Reviewed: Allergy & Precautions, H&P , NPO status , Patient's Chart, lab work & pertinent test results  History of Anesthesia Complications Negative for: history of anesthetic complications  Airway Mallampati: II  TM Distance: >3 FB Neck ROM: full    Dental  (+) Poor Dentition   Pulmonary Current Smoker,    Pulmonary exam normal breath sounds clear to auscultation       Cardiovascular negative cardio ROS Normal cardiovascular exam Rhythm:regular Rate:Normal     Neuro/Psych negative neurological ROS     GI/Hepatic negative GI ROS, Neg liver ROS,   Endo/Other  Morbid obesity  Renal/GU negative Renal ROS     Musculoskeletal   Abdominal   Peds  Hematology negative hematology ROS (+)   Anesthesia Other Findings NPO, denies nausea or vomiting, denies anesthesia issues in past  Morbidly obese  Reproductive/Obstetrics negative OB ROS                            Anesthesia Physical Anesthesia Plan  ASA: III  Anesthesia Plan: General   Post-op Pain Management:    Induction: Intravenous  Airway Management Planned: Oral ETT  Additional Equipment:   Intra-op Plan:   Post-operative Plan: Extubation in OR  Informed Consent: I have reviewed the patients History and Physical, chart, labs and discussed the procedure including the risks, benefits and alternatives for the proposed anesthesia with the patient or authorized representative who has indicated his/her understanding and acceptance.   Dental Advisory Given  Plan Discussed with: Anesthesiologist, CRNA and Surgeon  Anesthesia Plan Comments:        Anesthesia Quick Evaluation

## 2016-07-03 NOTE — Anesthesia Postprocedure Evaluation (Signed)
Anesthesia Post Note  Patient: Kelli Holland  Procedure(s) Performed: Procedure(s) (LRB): LAPAROSCOPIC CHOLECYSTECTOMY WITH INTRAOPERATIVE CHOLANGIOGRAM (N/A)  Patient location during evaluation: PACU Anesthesia Type: General Level of consciousness: awake and alert Pain management: pain level controlled Vital Signs Assessment: post-procedure vital signs reviewed and stable Respiratory status: spontaneous breathing, nonlabored ventilation, respiratory function stable and patient connected to nasal cannula oxygen Cardiovascular status: blood pressure returned to baseline and stable Postop Assessment: no signs of nausea or vomiting Anesthetic complications: no    Last Vitals:  Filed Vitals:   07/03/16 0917 07/03/16 0930  BP: 148/76 141/71  Pulse: 73 73  Temp:    Resp: 17 20    Last Pain:  Filed Vitals:   07/03/16 0938  PainSc: 9                  Kelli Holland, Kelli Holland

## 2016-07-03 NOTE — Op Note (Signed)
07/02/2016 - 07/03/2016  9:09 AM  PATIENT:  Kelli BurySuzanna Holland, 25 y.o., female, MRN: [id number]  PREOP DIAGNOSIS:  Cholecystitis, cholelithiasis  POSTOP DIAGNOSIS:   Cholecystitis, cholelithiasis, choledocholithiasis  PROCEDURE:   Procedure(s): LAPAROSCOPIC CHOLECYSTECTOMY WITH INTRAOPERATIVE CHOLANGIOGRAM  SURGEON:   Ovidio Kinavid Gwynevere Lizana, M.D.  Threasa HeadsASSISTANDaphine Deutscher:   Martin, M.D.  ANESTHESIA:   general  Anesthesiologist: Karlyne GreenspanBenjamin Judd, MD CRNA: Kizzie FantasiaKelley J Carver, CRNA  General  ASA: 2E  EBL:  Minimal  ml  BLOOD ADMINISTERED: none  DRAINS: none   LOCAL MEDICATIONS USED:   30 cc 1/4% marcaine  SPECIMEN:   Gall bladder  COUNTS CORRECT:  YES  INDICATIONS FOR PROCEDURE:  Kelli BurySuzanna Holland is a 25 y.o. (DOB: 16-May-1991) white  female whose primary care physician is Iona HansenJones, Penny L, NP and comes for cholecystectomy.   The indications and risks of the gall bladder surgery were explained to the patient.  The risks include, but are not limited to, infection, bleeding, common bile duct injury and open surgery.  SURGERY:  The [facility]  The abdomen was prepped with chloroprep.  The patient was on Rocephin prior to the beginning of the operation.   A time out was held and the surgical checklist run.   An infraumbilical incision was made into the abdominal cavity.  A 12 mm Hasson trocar was inserted into the abdominal cavity through the infraumbilical incision and secured with a 0 Vicryl suture.  Three additional trocars were inserted: a 10 mm trocar in the sub-xiphoid location, a 5 mm trocar in the right mid subcostal area, and a 5 mm trocar in the right lateral subcostal area.   The abdomen was explored and the liver, stomach, and bowel that could be seen were unremarkable.   The gall bladder was whitish, had evidence of chronic infection, but not acutely infected.  The was a 1.0 cm stone impacted at the neck of the gall bladder.  I grasped the gall bladder and rotated it  cephalad.  Disssection was carried down to the gall bladder/cystic duct junction and the cystic duct isolated.  A clip was placed on the gall bladder side of the cystic duct.   An intra-operative cholangiogram was shot.   The intra-operative cholangiogram was shot using a cut off Taut catheter placed through a 14 gauge angiocath in the RUQ.  The Taut catheter was inserted in the cut cystic duct and secured with an endoclip.  A cholangiogram was shot with 25 cc of 1/2 strength Omnipaque.  I shot 3 films. Using fluoroscopy, the cholangiogram showed the flow of contrast into the common bile duct, up the hepatic radicals, and into the duodenum.  There appear to be two filling defects in the CDB, consistent with choledocholithiasis.    The Taut catheter was removed.  The cystic duct was tripley endoclipped and the cystic artery was identified and clipped.  The gall bladder was bluntly and sharpley dissected from the gall bladder bed.   After the gall bladder was removed from the liver, the gall bladder bed and Triangle of [place] were inspected.  There was no bleeding or bile leak.  The gall bladder was placed in a endocatch bag and delivered through the umbilicus.  The abdomen was irrigated with 1,000 cc saline.   The trocars were then removed.  I infiltrated 30cc of 1/4% Marcaine into the incisions.  The umbilical port closed with a 0 Vicryl suture and the skin closed with 4-0 Monocryl.  The  skin was painted with LiquidBand.  The patient's sponge and needle count were correct.  The patient was transported to the RR in good condition.   I have spoken to Dr Kizzie Furnish about the finding of CBD stones.  Ovidio Kin, MD, [facility] Surgery Pager: [phone] Office phone:  [phone]

## 2016-07-04 ENCOUNTER — Inpatient Hospital Stay (HOSPITAL_COMMUNITY): Payer: BLUE CROSS/BLUE SHIELD | Admitting: Anesthesiology

## 2016-07-04 ENCOUNTER — Encounter (HOSPITAL_COMMUNITY): Admission: EM | Disposition: A | Discharge status: Home / Self Care | Payer: Self-pay | Source: Home / Self Care

## 2016-07-04 ENCOUNTER — Inpatient Hospital Stay (HOSPITAL_COMMUNITY): Payer: BLUE CROSS/BLUE SHIELD

## 2016-07-04 ENCOUNTER — Encounter (HOSPITAL_COMMUNITY): Payer: Self-pay | Admitting: *Deleted

## 2016-07-04 DIAGNOSIS — K8050 Calculus of bile duct without cholangitis or cholecystitis without obstruction: Secondary | ICD-10-CM | POA: Diagnosis not present

## 2016-07-04 DIAGNOSIS — K838 Other specified diseases of biliary tract: Secondary | ICD-10-CM

## 2016-07-04 HISTORY — PX: ERCP: SHX5425

## 2016-07-04 LAB — COMPREHENSIVE METABOLIC PANEL
ALK PHOS: 69 U/L (ref 38–126)
ALT: 25 U/L (ref 14–54)
ANION GAP: 4 — AB (ref 5–15)
AST: 19 U/L (ref 15–41)
Albumin: 3.7 g/dL (ref 3.5–5.0)
BILIRUBIN TOTAL: 0.3 mg/dL (ref 0.3–1.2)
BUN: <5 mg/dL — ABNORMAL LOW (ref 6–20)
CALCIUM: 8.9 mg/dL (ref 8.9–10.3)
CO2: 28 mmol/L (ref 22–32)
Chloride: 106 mmol/L (ref 101–111)
Creatinine, Ser: 0.59 mg/dL (ref 0.44–1.00)
GFR calc non Af Amer: >60 mL/min (ref >60)
Glucose, Bld: 135 mg/dL — ABNORMAL HIGH (ref 65–99)
Potassium: 4.3 mmol/L (ref 3.5–5.1)
Sodium: 138 mmol/L (ref 135–145)
Total Protein: 6.6 g/dL (ref 6.5–8.1)

## 2016-07-04 SURGERY — ERCP, WITH INTERVENTION IF INDICATED
Anesthesia: General

## 2016-07-04 MED ORDER — LACTATED RINGERS IV SOLN
INTRAVENOUS | Status: DC | PRN
  Administered 2016-07-04 (×2): via INTRAVENOUS

## 2016-07-04 MED ORDER — MENTHOL 3 MG MT LOZG
1.0000 | LOZENGE | OROMUCOSAL | Status: DC | PRN
  Filled 2016-07-04: qty 9

## 2016-07-04 MED ORDER — MIDAZOLAM HCL 5 MG/5ML IJ SOLN
INTRAMUSCULAR | Status: DC | PRN
  Administered 2016-07-04: 2 mg via INTRAVENOUS

## 2016-07-04 MED ORDER — INDOMETHACIN 50 MG RE SUPP
RECTAL | Status: AC
  Filled 2016-07-04: qty 2

## 2016-07-04 MED ORDER — MIDAZOLAM HCL 2 MG/2ML IJ SOLN
INTRAMUSCULAR | Status: AC
  Filled 2016-07-04: qty 2

## 2016-07-04 MED ORDER — GLUCAGON HCL RDNA (DIAGNOSTIC) 1 MG IJ SOLR
INTRAMUSCULAR | Status: DC | PRN
  Administered 2016-07-04: .5 mg via INTRAVENOUS

## 2016-07-04 MED ORDER — ONDANSETRON HCL 4 MG/2ML IJ SOLN
INTRAMUSCULAR | Status: AC
  Filled 2016-07-04: qty 2

## 2016-07-04 MED ORDER — SODIUM CHLORIDE 0.9 % IV SOLN
INTRAVENOUS | Status: DC | PRN
  Administered 2016-07-04: 30 mL

## 2016-07-04 MED ORDER — OXYCODONE-ACETAMINOPHEN 5-325 MG PO TABS
1.0000 | ORAL_TABLET | ORAL | Status: DC | PRN
  Administered 2016-07-04 – 2016-07-05 (×4): 2 via ORAL
  Filled 2016-07-04 (×4): qty 2

## 2016-07-04 MED ORDER — PROPOFOL 10 MG/ML IV BOLUS
INTRAVENOUS | Status: DC | PRN
  Administered 2016-07-04: 200 mg via INTRAVENOUS

## 2016-07-04 MED ORDER — LIDOCAINE 2% (20 MG/ML) 5 ML SYRINGE
INTRAMUSCULAR | Status: DC | PRN
  Administered 2016-07-04: 50 mg via INTRAVENOUS

## 2016-07-04 MED ORDER — GLUCAGON HCL RDNA (DIAGNOSTIC) 1 MG IJ SOLR
INTRAMUSCULAR | Status: AC
  Filled 2016-07-04: qty 1

## 2016-07-04 MED ORDER — INDOMETHACIN 50 MG RE SUPP
RECTAL | Status: DC | PRN
  Administered 2016-07-04: 100 mg via RECTAL

## 2016-07-04 MED ORDER — SUCCINYLCHOLINE CHLORIDE 200 MG/10ML IV SOSY
PREFILLED_SYRINGE | INTRAVENOUS | Status: DC | PRN
  Administered 2016-07-04: 100 mg via INTRAVENOUS

## 2016-07-04 MED ORDER — FENTANYL CITRATE (PF) 100 MCG/2ML IJ SOLN
INTRAMUSCULAR | Status: DC | PRN
  Administered 2016-07-04: 100 ug via INTRAVENOUS

## 2016-07-04 MED ORDER — PROPOFOL 10 MG/ML IV BOLUS
INTRAVENOUS | Status: AC
  Filled 2016-07-04: qty 20

## 2016-07-04 MED ORDER — LIDOCAINE HCL (CARDIAC) 20 MG/ML IV SOLN
INTRAVENOUS | Status: AC
  Filled 2016-07-04: qty 5

## 2016-07-04 MED ORDER — FENTANYL CITRATE (PF) 100 MCG/2ML IJ SOLN
INTRAMUSCULAR | Status: AC
Start: 2016-07-04 — End: 2016-07-04
  Filled 2016-07-04: qty 2

## 2016-07-04 NOTE — Anesthesia Procedure Notes (Signed)
Procedure Name: Intubation Performed by: Jhonnie GarnerMARSHALL, Sylvia Kondracki M Pre-anesthesia Checklist: Patient identified, Emergency Drugs available, Suction available, Patient being monitored and Timeout performed Patient Re-evaluated:Patient Re-evaluated prior to inductionOxygen Delivery Method: Circle system utilized Preoxygenation: Pre-oxygenation with 100% oxygen Intubation Type: IV induction Ventilation: Mask ventilation without difficulty Laryngoscope Size: Mac and 3 Grade View: Grade I Tube type: Oral Tube size: 7.0 mm Number of attempts: 1 Airway Equipment and Method: Stylet Placement Confirmation: ETT inserted through vocal cords under direct vision,  positive ETCO2,  CO2 detector and breath sounds checked- equal and bilateral Secured at: 22 cm Tube secured with: Tape Dental Injury: Teeth and Oropharynx as per pre-operative assessment

## 2016-07-04 NOTE — Op Note (Signed)
[facility] Patient Name: Kelli BurySuzanna Holland Procedure Date: 07/04/2016 MRN: [id number] Attending MD: Starr LakeHenry L. Myrtie Neitheranis , MD Date of Birth: Sep 22, 1991 CSN: [id number] Age: 25 Admit Type: Inpatient Procedure:                ERCP Indications:              Bile duct stone(s) seen on IOC Providers:                Sherilyn CooterHenry L. Myrtie Neitheranis, MD, Dwain SarnaPatricia Ford, RN, Oletha Blendavida                            Shoffner, Technician, Mirian MoKelley Carver, CRNA Referring MD:              Medicines:                General Anesthesia, Indomethacin 100 mg PR, Unasyn                            1.5 g IV Complications:            No immediate complications. Estimated Blood Loss:     Estimated blood loss: none. Procedure:                Pre-Anesthesia Assessment:                           - Prior to the procedure, a History and Physical                            was performed, and patient medications and                            allergies were reviewed. The patient's tolerance of                            previous anesthesia was also reviewed. The risks                            and benefits of the procedure and the sedation                            options and risks were discussed with the patient.                            All questions were answered, and informed consent                            was obtained. Prior Anticoagulants: The patient has                            taken no previous anticoagulant or antiplatelet                            agents. ASA Grade Assessment: II - A patient with  mild systemic disease. After reviewing the risks                            and benefits, the patient was deemed in                            satisfactory condition to undergo the procedure.                           After obtaining informed consent, the scope was                            passed under direct vision. Throughout the                            procedure, the patient's blood  pressure, pulse, and                            oxygen saturations were monitored continuously. The                            ZO-[id number] ([id number]) scope was introduced through                            the mouth, and used to inject contrast into and                            used to inject contrast into the bile duct. The                            ERCP was accomplished without difficulty. The                            patient tolerated the procedure well. Scope In: Scope Out: Findings:      A scout film of the abdomen was obtained. Surgical clips, consistent       with a previous cholecystectomy, were seen in the area of the right       upper quadrant of the abdomen. The esophagus was successfully intubated       under direct vision. The scope was advanced to a normal major papilla in       the descending duodenum without detailed examination of the pharynx,       larynx and associated structures, and upper GI tract. The upper GI tract       was grossly normal. The bile duct was deeply cannulated with the       traction (standard) sphincterotome. Contrast was injected. I personally       interpreted the bile duct images. There was brisk flow of contrast       through the ducts. Image quality was excellent. Contrast extended to the       hepatic ducts. The common bile duct contained two stones, the largest of       which was 8 mm in diameter. The common bile duct was diffusely dilated.       0.035 inch x 260 cm straight Hydra  Al Pimple was passed into the biliary       tree. A 8 mm biliary sphincterotomy was made with a traction (standard)       sphincterotome using blended current. There was no post-sphincterotomy       bleeding. The biliary tree was swept with a 12 mm balloon starting at       the bifurcation. Two cholesterol stones were removed. No stones remained. Impression:               - The common bile duct was dilated.                           - Choledocholithiasis was found.  Complete removal                            was accomplished by biliary sphincterotomy and                            balloon extraction.                           - A biliary sphincterotomy was performed.                           - The biliary tree was swept. Moderate Sedation:      GETA Recommendation:           - Avoid aspirin and nonsteroidal anti-inflammatory                            medicines for 1 week. Procedure Code(s):        --- Professional ---                           [phone], Endoscopic retrograde                            cholangiopancreatography (ERCP); with removal of                            calculi/debris from biliary/pancreatic duct(s)                           43262, Endoscopic retrograde                            cholangiopancreatography (ERCP); with                            sphincterotomy/papillotomy Diagnosis Code(s):        --- Professional ---                           K80.50, Calculus of bile duct without cholangitis                            or cholecystitis without obstruction  K83.8, Other specified diseases of biliary tract CPT copyright 2016 American Medical Association. All rights reserved. The codes documented in this report are preliminary and upon coder review may  be revised to meet current compliance requirements. Henry L. Myrtie Neither, MD 07/04/2016 12:14:27 PM This report has been signed electronically. Number of Addenda: 0

## 2016-07-04 NOTE — Anesthesia Postprocedure Evaluation (Signed)
Anesthesia Post Note  Patient: Kelli BurySuzanna Gruen  Procedure(s) Performed: Procedure(s) (LRB): ENDOSCOPIC RETROGRADE CHOLANGIOPANCREATOGRAPHY (ERCP) (N/A)  Patient location during evaluation: Endoscopy Anesthesia Type: General Level of consciousness: awake and alert Pain management: pain level controlled Vital Signs Assessment: post-procedure vital signs reviewed and stable Respiratory status: spontaneous breathing, nonlabored ventilation, respiratory function stable and patient connected to nasal cannula oxygen Cardiovascular status: blood pressure returned to baseline and stable Postop Assessment: no signs of nausea or vomiting Anesthetic complications: no    Last Vitals:  Filed Vitals:   07/04/16 1230 07/04/16 1240  BP: 153/94 117/95  Pulse: 55 57  Temp:    Resp: 17 17    Last Pain:  Filed Vitals:   07/04/16 1323  PainSc: 3                  Cecile HearingStephen Edward Vardaan Depascale

## 2016-07-04 NOTE — Progress Notes (Signed)
Pt has scant vaginal bleeding post ERCP. Has IUI in place.

## 2016-07-04 NOTE — Progress Notes (Signed)
1 Day Post-Op  Subjective:  Stable and alert.  Denies nausea or vomiting.  Able to void.  Pain seems well controlled. Afebrile.  Heart rate 62. Lab work this morning reveals normal liver function test.  Appreciate consultation from Dr. Marina GoodellPerry of the GI service. Planning ERCP today for retained CBD stone seen on intraoperative cholangiogram  Objective: Vital signs in last 24 hours: Temp:  [97.5 F (36.4 C)-98.5 F (36.9 C)] 97.7 F (36.5 C) (07/10 0552) Pulse Rate:  [55-85] 62 (07/10 0552) Resp:  [14-20] 18 (07/10 0552) BP: (113-148)/(54-89) 126/66 mmHg (07/10 0552) SpO2:  [96 %-100 %] 97 % (07/10 0552) Last BM Date: 07/02/16  Intake/Output from previous day: 07/09 0701 - 07/10 0700 In: 1740 [P.O.:740; I.V.:1000] Out: 3925 [Urine:3900; Blood:25] Intake/Output this shift: Total I/O In: 240 [P.O.:240] Out: 800 [Urine:800]  General appearance: Alert.  Cooperative.  Minimal distress.  Family member in room. GI: Soft.  Nondistended.  Wounds clean.  Minimal tenderness.  Appropriate.  Lab Results:  Results for orders placed or performed during the hospital encounter of 07/02/16 (from the past 24 hour(s))  Comprehensive metabolic panel     Status: Abnormal   Collection Time: 07/04/16  4:48 AM  Result Value Ref Range   Sodium 138 135 - 145 mmol/L   Potassium 4.3 3.5 - 5.1 mmol/L   Chloride 106 101 - 111 mmol/L   CO2 28 22 - 32 mmol/L   Glucose, Bld 135 (H) 65 - 99 mg/dL   BUN <5 (L) 6 - 20 mg/dL   Creatinine, Ser 1.610.59 0.44 - 1.00 mg/dL   Calcium 8.9 8.9 - 09.610.3 mg/dL   Total Protein 6.6 6.5 - 8.1 g/dL   Albumin 3.7 3.5 - 5.0 g/dL   AST 19 15 - 41 U/L   ALT 25 14 - 54 U/L   Alkaline Phosphatase 69 38 - 126 U/L   Total Bilirubin 0.3 0.3 - 1.2 mg/dL   GFR calc non Af Amer >60 >60 mL/min   GFR calc Af Amer >60 >60 mL/min   Anion gap 4 (L) 5 - 15     Studies/Results: Dg Cholangiogram Operative  07/03/2016  CLINICAL DATA:  Cholelithiasis EXAM: INTRAOPERATIVE CHOLANGIOGRAM  TECHNIQUE: Cholangiographic images from the C-arm fluoroscopic device were submitted for interpretation post-operatively. Please see the procedural report for the amount of contrast and the fluoroscopy time utilized. COMPARISON:  None. FINDINGS: Contrast fills the biliary tree and duodenum. There are multiple filling defects in the common bile duct. IMPRESSION: Findings are worrisome for common bile duct stones. MRCP or ERCP can be performed to further delineate. Electronically Signed   By: Jolaine ClickArthur  Hoss M.D.   On: 07/03/2016 09:12    . ampicillin-sulbactam (UNASYN) 1.5 g IVPB  1.5 g Intravenous 30 min Pre-Op  . cefTRIAXone (ROCEPHIN)  IV  2 g Intravenous QHS  . indomethacin  100 mg Rectal 30 min Pre-Op  . nicotine  14 mg Transdermal Daily     Assessment/Plan: s/p Procedure(s): LAPAROSCOPIC CHOLECYSTECTOMY WITH INTRAOPERATIVE CHOLANGIOGRAM  POD #1.  Laparoscopic cholecystectomy with cholangiogram Stable surgically Continue nothing by mouth in anticipation of ERCP today  Choledocholithiasis. ERCP today  Obesity Tobacco abuse   @PROBHOSP @  LOS: 2 days    Kelli Holland M 07/04/2016

## 2016-07-04 NOTE — Transfer of Care (Signed)
Immediate Anesthesia Transfer of Care Note  Patient: Kelli Holland  Procedure(s) Performed: Procedure(s): ENDOSCOPIC RETROGRADE CHOLANGIOPANCREATOGRAPHY (ERCP) (N/A)  Patient Location: PACU  Anesthesia Type:General  Level of Consciousness:  sedated, patient cooperative and responds to stimulation  Airway & Oxygen Therapy:Patient Spontanous Breathing and Patient connected to face mask oxgen  Post-op Assessment:  Report given to PACU RN and Post -op Vital signs reviewed and stable  Post vital signs:  Reviewed and stable  Last Vitals:  Filed Vitals:   07/04/16 0552 07/04/16 0950  BP: 126/66 151/83  Pulse: 62 53  Temp: 36.5 C 36.6 C  Resp: 18     Complications: No apparent anesthesia complications

## 2016-07-04 NOTE — H&P (View-Only) (Signed)
REQUESTING PROVIDER: Dr. Ovidio Kinavid Newman, [facility] REASON FOR CONSULTATION: Choledocholithiasis post laparoscopic cholecystectomy   HISTORY OF PRESENT ILLNESS:  Kelli Holland is a 25 y.o. obese female with no significant past medical or past surgical history was had recent problems with biliary colic. Underwent laparoscopic cholecystectomy earlier today. Intraoperative cholangiogram demonstrates choledocholithiasis without obstruction. Preoperative liver tests and lipase normal. The patient is in her room with family members. Complains of mild incisional pain only. I have personally reviewed laboratory data and x-rays.  REVIEW OF SYSTEMS:  All non-GI ROS negative except for  Past Medical History  Diagnosis Date  . Gallstones     Past Surgical History  Procedure Laterality Date  . Eye surgery      Social History Kelli Holland  reports that she has been smoking Cigarettes.  She has been smoking about 0.25 packs per day. She has never used smokeless tobacco. She reports that she drinks alcohol. She reports that she does not use illicit drugs.. Works Physicist, [employer] FAMILY HISTORY family history includes Hypertension in her father.  Allergies  Allergen Reactions  . Other Anaphylaxis and Hives    Horseradish       PHYSICAL EXAMINATION: Vital signs: BP 136/73 mmHg  Pulse 60  Temp(Src) 97.6 F (36.4 C) (Oral)  Resp 17  Ht 5\' 3"  (1.6 m)  Wt 250 lb (113.399 kg)  BMI 44.30 kg/m2  SpO2 100%  Constitutional: Obese, generally well-appearing, no acute distress Psychiatric: alert and oriented x3, cooperative Eyes: extraocular movements intact, anicteric, conjunctiva pink Mouth: oral pharynx moist, no lesions. Poor dentition Neck: supple no lymphadenopathy Cardiovascular: heart regular rate and rhythm, no murmur Lungs: clear to auscultation bilaterally Abdomen: soft, obese, incisional tenderness but otherwise nontender, nondistended, no obvious ascites, no peritoneal  signs, normal bowel sounds, no organomegaly Rectal: Omitted Extremities: no clubbing cyanosis or lower extremity edema bilaterally Skin: no lesions on visible extremities Neuro: No focal deficits.   ASSESSMENT:  1. Nonobstructive Choledocholithiasis post cholecystectomy 2. Morbid obesity   PLAN:  1. ERCP with possible stricture and stone extraction. This will be set up with Dr. Myrtie Neitheranis tomorrow morning.The nature of the procedure, as well as the risks (failed cannulation, pancreatitis, perforation, bleeding, infection, allergic reaction) sees, benefits, and alternatives were carefully and thoroughly reviewed with the patient. Ample time for discussion and questions allowed. The patient understood, was satisfied, and agreed to proceed.  Wilhemina BonitoJohn N. Eda KeysPerry, Jr., M.D. [facility]

## 2016-07-04 NOTE — Anesthesia Preprocedure Evaluation (Signed)
Anesthesia Evaluation  Patient identified by MRN, date of birth, ID band Patient awake    Reviewed: Allergy & Precautions, H&P , NPO status , Patient's Chart, lab work & pertinent test results  History of Anesthesia Complications Negative for: history of anesthetic complications  Airway Mallampati: II  TM Distance: >3 FB Neck ROM: full    Dental  (+) Poor Dentition, Chipped   Pulmonary Current Smoker,    Pulmonary exam normal breath sounds clear to auscultation       Cardiovascular negative cardio ROS Normal cardiovascular exam Rhythm:regular Rate:Normal     Neuro/Psych negative neurological ROS  negative psych ROS   GI/Hepatic negative GI ROS, CBD stone   Endo/Other  Morbid obesity  Renal/GU negative Renal ROS     Musculoskeletal negative musculoskeletal ROS (+)   Abdominal   Peds  Hematology negative hematology ROS (+)   Anesthesia Other Findings Day of surgery medications reviewed with the patient.  Reproductive/Obstetrics negative OB ROS                             Anesthesia Physical Anesthesia Plan  ASA: III  Anesthesia Plan: General   Post-op Pain Management:    Induction: Intravenous  Airway Management Planned: Oral ETT  Additional Equipment:   Intra-op Plan:   Post-operative Plan: Extubation in OR  Informed Consent: I have reviewed the patients History and Physical, chart, labs and discussed the procedure including the risks, benefits and alternatives for the proposed anesthesia with the patient or authorized representative who has indicated his/her understanding and acceptance.   Dental advisory given  Plan Discussed with: CRNA  Anesthesia Plan Comments: (Risks/benefits of general anesthesia discussed with patient including risk of damage to teeth, lips, gum, and tongue, nausea/vomiting, allergic reactions to medications, and the possibility of heart attack,  stroke and death.  All patient questions answered.  Patient wishes to proceed.)        Anesthesia Quick Evaluation

## 2016-07-04 NOTE — Interval H&P Note (Signed)
History and Physical Interval Note:  07/04/2016 11:10 AM  Kelli Holland  has presented today for surgery, with the diagnosis of CBD stone  The various methods of treatment have been discussed with the patient and family. After consideration of risks, benefits and other options for treatment, the patient has consented to  Procedure(s): ENDOSCOPIC RETROGRADE CHOLANGIOPANCREATOGRAPHY (ERCP) (N/A) as a surgical intervention .  The patient's history has been reviewed, patient examined, no change in status, stable for surgery.  I have reviewed the patient's chart and labs.  Questions were answered to the patient's satisfaction.     Charlie PitterHenry L Danis III

## 2016-07-05 ENCOUNTER — Encounter: Payer: Self-pay | Admitting: General Surgery

## 2016-07-05 ENCOUNTER — Encounter (HOSPITAL_COMMUNITY): Payer: Self-pay | Admitting: Gastroenterology

## 2016-07-05 DIAGNOSIS — K8050 Calculus of bile duct without cholangitis or cholecystitis without obstruction: Secondary | ICD-10-CM | POA: Diagnosis not present

## 2016-07-05 DIAGNOSIS — K838 Other specified diseases of biliary tract: Secondary | ICD-10-CM | POA: Diagnosis not present

## 2016-07-05 MED ORDER — IBUPROFEN 200 MG PO TABS: ORAL_TABLET | ORAL | Status: DC

## 2016-07-05 MED ORDER — OXYCODONE-ACETAMINOPHEN 5-325 MG PO TABS: 1.0000 | ORAL_TABLET | ORAL | Status: DC | PRN

## 2016-07-05 MED ORDER — OXYCODONE-ACETAMINOPHEN 5-325 MG PO TABS: 1.0000 | ORAL_TABLET | ORAL | Status: AC | PRN

## 2016-07-05 MED ORDER — ACETAMINOPHEN 325 MG PO TABS: ORAL_TABLET | ORAL | Status: AC

## 2016-07-05 MED ORDER — ACETAMINOPHEN 325 MG PO TABS: 650.0000 mg | ORAL_TABLET | Freq: Four times a day (QID) | ORAL | Status: DC | PRN

## 2016-07-05 NOTE — Progress Notes (Signed)
Progress Note   Subjective  [name] is a 25 year old Caucasian female who we consulted on 07/03/16 for a positive intraoperative cholangiogram showing choledocholithiasis without obstruction. ERCP was performed on 07/04/2016 with successful removal of stones and sphincterotomy.  At time of interview this morning patient is standing by the side of her bed about to go for a walk down the hall. She tells me that she does continue with a small amount of abdominal discomfort, but knows that this is because of "all the procedures I have had done lately". The patient expresses that she was able to tolerate a regular diet this morning and is aware of plans for discharge later this afternoon. She does tell me that she had a lot of gas overnight.   Objective   Vital signs in last 24 hours: Temp:  [97.8 F (36.6 C)-98.3 F (36.8 C)] 98.1 F (36.7 C) (07/11 0551) Pulse Rate:  [52-86] 69 (07/11 0551) Resp:  [15-19] 16 (07/11 0551) BP: (117-162)/(73-95) 123/74 mmHg (07/11 0551) SpO2:  [98 %-100 %] 98 % (07/11 0551) Last BM Date: 07/02/16 General: Caucasian female in NAD Heart:  Regular rate and rhythm; no murmurs Lungs: Respirations even and unlabored, lungs CTA bilaterally Abdomen:  Soft, mild generalized TTP and nondistended. Normal bowel sounds. Extremities:  Without edema. Neurologic:  Alert and oriented,  grossly normal neurologically. Psych:  Cooperative. Normal mood and affect.  Intake/Output from previous day: 07/10 0701 - 07/11 0700 In: 1240 [P.O.:240; I.V.:1000] Out: 0   Lab Results:  Recent Labs  07/02/16 1755  WBC 10.3  HGB 14.8  HCT 43.1  PLT 323   BMET  Recent Labs  07/02/16 1755 07/04/16 0448  NA 136 138  K 4.0 4.3  CL 105 106  CO2 24 28  GLUCOSE 93 135*  BUN 13 <5*  CREATININE 0.72 0.59  CALCIUM 9.2 8.9   LFT  Recent Labs  07/04/16 0448  PROT 6.6  ALBUMIN 3.7  AST 19  ALT 25  ALKPHOS 69  BILITOT 0.3   PT/INR No results for input(s):  LABPROT, INR in the last 72 hours.  Studies/Results: Dg Ercp With Sphincterotomy  07/04/2016  CLINICAL DATA:  Choledocholithiasis. EXAM: ERCP TECHNIQUE: Multiple spot images obtained with the fluoroscopic device and submitted for interpretation post-procedure. COMPARISON:  Intraoperative cholangiogram during laparoscopic cholecystectomy - 07/03/2016 FINDINGS: Nine spot intraoperative fluoroscopic images of the right upper abdominal quadrant during ERCP are provided for review. Initial image demonstrates an ERCP probe overlying the right upper abdominal quadrant. There is selective cannulation opacification of the CBD which appears mildly dilated. Surgical clips overlie the expected location of the gallbladder fossa. There is no significant opacification of the cystic duct. There is minimal opacification the central aspect of the intrahepatic biliary tree which appears nondilated. No discrete persistent filling defects in the opacified portions of the biliary tree to suggest choledocholithiasis. Subsequent images demonstrate insufflation of a balloon within the distal aspect of the CBD with subsequent presumed biliary sweeping and sphincterotomy. IMPRESSION: ERCP with biliary sweeping and presumed sphincterotomy as above. These images were submitted for radiologic interpretation only. Please see the procedural report for the amount of contrast and the fluoroscopy time utilized. Electronically Signed   By: Simonne Come M.D.   On: 07/04/2016 14:24   07/05/16 ERCP, Dr. Myrtie Neither: Impression: The common bile duct was dilated, choledocholithiasis was found and complete removal was accomplished by biliary sphincterotomy and balloon extraction, the biliary tree was swept    Assessment / Plan:  Assessment: 1. Choledocholithiasis: Patient with positive IOC and ERCP on 07/04/16 with removal of bile duct stones via cingulotomy and balloon extraction  Plan: 1. Continue supportive measures 2. Agree with plans for  discharge 3. Would recommend the patient avoid aspirin and NSAIDs for 1 week 4. Will discuss above with Dr. Myrtie Neitheranis, we will likely sign off  Thank you for your kind consultation, again we will likely sign off  Active Problems:   Cholecystitis     LOS: 3 days   Unk Kelli Holland  07/05/2016, 10:53 AM  Pager # [phone]

## 2016-07-05 NOTE — Discharge Instructions (Signed)
If you wish to quit smoking, help is available.  For free tobacco cessation program offerings call the [facility] at 947-836-6549 or Live Well Line at 2767815355. You may also visit [email or web address] or email [email or web address]  for more information on other programs.  Smoking Cessation, Tips for Success If you are ready to quit smoking, congratulations! You have chosen to help yourself be healthier. Cigarettes bring nicotine, tar, carbon monoxide, and other irritants into your body. Your lungs, heart, and blood vessels will be able to work better without these poisons. There are many different ways to quit smoking. Nicotine gum, nicotine patches, a nicotine inhaler, or nicotine nasal spray can help with physical craving. Hypnosis, support groups, and medicines help break the habit of smoking. WHAT THINGS CAN I DO TO MAKE QUITTING EASIER?  Here are some tips to help you quit for good:  Pick a date when you will quit smoking completely. Tell all of your friends and family about your plan to quit on that date.  Do not try to slowly cut down on the number of cigarettes you are smoking. Pick a quit date and quit smoking completely starting on that day.  Throw away all cigarettes.   Clean and remove all ashtrays from your home, work, and car.  On a card, write down your reasons for quitting. Carry the card with you and read it when you get the urge to smoke.  Cleanse your body of nicotine. Drink enough water and fluids to keep your urine clear or pale yellow. Do this after quitting to flush the nicotine from your body.  Learn to predict your moods. Do not let a bad situation be your excuse to have a cigarette. Some situations in your life might tempt you into wanting a cigarette.  Never have "just one" cigarette. It leads to wanting another and another. Remind yourself of your decision to quit.  Change habits associated with smoking. If you smoked while driving or  when feeling stressed, try other activities to replace smoking. Stand up when drinking your coffee. Brush your teeth after eating. Sit in a different chair when you read the paper. Avoid alcohol while trying to quit, and try to drink fewer caffeinated beverages. Alcohol and caffeine may urge you to smoke.  Avoid foods and drinks that can trigger a desire to smoke, such as sugary or spicy foods and alcohol.  Ask people who smoke not to smoke around you.  Have something planned to do right after eating or having a cup of coffee. For example, plan to take a walk or exercise.  Try a relaxation exercise to calm you down and decrease your stress. Remember, you may be tense and nervous for the first 2 weeks after you quit, but this will pass.  Find new activities to keep your hands busy. Play with a pen, coin, or rubber band. Doodle or draw things on paper.  Brush your teeth right after eating. This will help cut down on the craving for the taste of tobacco after meals. You can also try mouthwash.   Use oral substitutes in place of cigarettes. Try using lemon drops, carrots, cinnamon sticks, or chewing gum. Keep them handy so they are available when you have the urge to smoke.  When you have the urge to smoke, try deep breathing.  Designate your home as a nonsmoking area.  If you are a heavy smoker, ask your health care provider about a prescription for nicotine chewing gum. It  can ease your withdrawal from nicotine.  Reward yourself. Set aside the cigarette money you save and buy yourself something nice.  Look for support from others. Join a support group or smoking cessation program. Ask someone at home or at work to help you with your plan to quit smoking.  Always ask yourself, "Do I need this cigarette or is this just a reflex?" Tell yourself, "Today, I choose not to smoke," or "I do not want to smoke." You are reminding yourself of your decision to quit.  Do not replace cigarette smoking  with electronic cigarettes (commonly called e-cigarettes). The safety of e-cigarettes is unknown, and some may contain harmful chemicals.  If you relapse, do not give up! Plan ahead and think about what you will do the next time you get the urge to smoke. HOW WILL I FEEL WHEN I QUIT SMOKING? You may have symptoms of withdrawal because your body is used to nicotine (the addictive substance in cigarettes). You may crave cigarettes, be irritable, feel very hungry, cough often, get headaches, or have difficulty concentrating. The withdrawal symptoms are only temporary. They are strongest when you first quit but will go away within 10-14 days. When withdrawal symptoms occur, stay in control. Think about your reasons for quitting. Remind yourself that these are signs that your body is healing and getting used to being without cigarettes. Remember that withdrawal symptoms are easier to treat than the major diseases that smoking can cause.  Even after the withdrawal is over, expect periodic urges to smoke. However, these cravings are generally short lived and will go away whether you smoke or not. Do not smoke! WHAT RESOURCES ARE AVAILABLE TO HELP ME QUIT SMOKING? Your health care provider can direct you to community resources or hospitals for support, which may include:  Group support.  Education.  Hypnosis.  Therapy.   This information is not intended to replace advice given to you by your health care provider. Make sure you discuss any questions you have with your health care provider.   Document Released: 09/09/2004 Document Revised: 01/02/2015 Document Reviewed: 05/30/2013 Elsevier Interactive Patient Education 2016 [employer] Inc.  Laparoscopic Cholecystectomy, Care After Refer to this sheet in the next few weeks. These instructions provide you with information about caring for yourself after your procedure. Your health care provider may also give you more specific instructions. Your treatment  has been planned according to current medical practices, but problems sometimes occur. Call your health care provider if you have any problems or questions after your procedure. WHAT TO EXPECT AFTER THE PROCEDURE After your procedure, it is common to have:  Pain at your incision sites. You will be given pain medicines to control your pain.  Mild nausea or vomiting. This should improve after the first 24 hours.  Bloating and possible shoulder pain from the gas that was used during the procedure. This will improve after the first 24 hours. HOME CARE INSTRUCTIONS Incision Care  Follow instructions from your health care provider about how to take care of your incisions. Make sure you:  Wash your hands with soap and water before you change your bandage (dressing). If soap and water are not available, use hand sanitizer.  Change your dressing as told by your health care provider.  Leave stitches (sutures), skin glue, or adhesive strips in place. These skin closures may need to be in place for 2 weeks or longer. If adhesive strip edges start to loosen and curl up, you may trim the loose edges.  Do not remove adhesive strips completely unless your health care provider tells you to do that.  Do not take baths, swim, or use a hot tub until your health care provider approves. Ask your health care provider if you can take showers. You may only be allowed to take sponge baths for bathing. General Instructions  Take over-the-counter and prescription medicines only as told by your health care provider.  Do not drive or operate heavy machinery while taking prescription pain medicine.  Return to your normal diet as told by your health care provider.  Do not lift anything that is heavier than 10 lb (4.5 kg).  Do not play contact sports for one week or until your health care provider approves. SEEK MEDICAL CARE IF:   You have redness, swelling, or pain at the site of your incision.  You have fluid,  blood, or pus coming from your incision.  You notice a bad smell coming from your incision area.  Your surgical incisions break open.  You have a fever. SEEK IMMEDIATE MEDICAL CARE IF:  You develop a rash.  You have difficulty breathing.  You have chest pain.  You have increasing pain in your shoulders (shoulder strap areas).  You faint or have dizzy episodes while you are standing.  You have severe pain in your abdomen.  You have nausea or vomiting that lasts for more than one day.   This information is not intended to replace advice given to you by your health care provider. Make sure you discuss any questions you have with your health care provider.   Document Released: 12/12/2005 Document Revised: 09/02/2015 Document Reviewed: 07/24/2013 Elsevier Interactive Patient Education 2016 [employer].  [facility] ______[facility] LAPAROSCOPIC SURGERY: POST OP INSTRUCTIONS Always review your discharge instruction sheet given to you by the facility where your surgery was performed. IF YOU HAVE DISABILITY OR FAMILY LEAVE FORMS, YOU MUST BRING THEM TO THE OFFICE FOR PROCESSING.   DO NOT GIVE THEM TO YOUR DOCTOR.  1. A prescription for pain medication may be given to you upon discharge.  Take your pain medication as prescribed, if needed.  If narcotic pain medicine is not needed, then you may take acetaminophen (Tylenol) or ibuprofen (Advil) as needed. 2. Take your usually prescribed medications unless otherwise directed. 3. If you need a refill on your pain medication, please contact your pharmacy.  They will contact our office to request authorization. Prescriptions will not be filled after 5pm or on week-ends. 4. You should follow a light diet the first few days after arrival home, such as soup and crackers, etc.  Be sure to include lots of fluids daily. 5. Most patients will experience some swelling and bruising in the area of the incisions.  Ice packs will help.   Swelling and bruising can take several days to resolve.  6. It is common to experience some constipation if taking pain medication after surgery.  Increasing fluid intake and taking a stool softener (such as Colace) will usually help or prevent this problem from occurring.  A mild laxative (Milk of Magnesia or Miralax) should be taken according to package instructions if there are no bowel movements after 48 hours. 7. Unless discharge instructions indicate otherwise, you may remove your bandages 24-48 hours after surgery, and you may shower at that time.  You may have steri-strips (small skin tapes) in place directly over the incision.  These strips should be left on the skin for 7-10 days.  If your surgeon  used skin glue on the incision, you may shower in 24 hours.  The glue will flake off over the next 2-3 weeks.  Any sutures or staples will be removed at the office during your follow-up visit. 8. ACTIVITIES:  You may resume regular (light) daily activities beginning the next day--such as daily self-care, walking, climbing stairs--gradually increasing activities as tolerated.  You may have sexual intercourse when it is comfortable.  Refrain from any heavy lifting or straining until approved by your doctor. a. You may drive when you are no longer taking prescription pain medication, you can comfortably wear a seatbelt, and you can safely maneuver your car and apply brakes. b. RETURN TO WORK:  __________________________________________________________ 9. You should see your doctor in the office for a follow-up appointment approximately 2-3 weeks after your surgery.  Make sure that you call for this appointment within a day or two after you arrive home to insure a convenient appointment time. 10. OTHER INSTRUCTIONS: __________________________________________________________________________________________________________________________  __________________________________________________________________________________________________________________________ WHEN TO CALL YOUR DOCTOR: 1. Fever over 101.0 2. Inability to urinate 3. Continued bleeding from incision. 4. Increased pain, redness, or drainage from the incision. 5. Increasing abdominal pain  The clinic staff is available to answer your questions during regular business hours.  Please dont hesitate to call and ask to speak to one of the nurses for clinical concerns.  If you have a medical emergency, go to the nearest emergency room or call 911.  A surgeon from [facility] is always on call at the hospital. [address], [place], [place]  [address] ? [address] [address], [place], [place]   [address] [phone] ? [phone] ? FAX [phone] Web site: [email or web address]

## 2016-07-05 NOTE — Progress Notes (Signed)
1 Day Post-Op  Subjective: She has not really been OOB or eaten so far today, sore and upset.  Her IV came apart with BP check and she had a panic attack.  Still now really over it.  Has been seen in the past for this but not recently.    Objective: Vital signs in last 24 hours: Temp:  [97.8 F (36.6 C)-98.3 F (36.8 C)] 98.1 F (36.7 C) (07/11 0551) Pulse Rate:  [52-86] 69 (07/11 0551) Resp:  [15-19] 16 (07/11 0551) BP: (117-162)/(73-95) 123/74 mmHg (07/11 0551) SpO2:  [98 %-100 %] 98 % (07/11 0551) Weight:  [113.399 kg (250 lb)] 113.399 kg (250 lb) (07/10 0950) Last BM Date: 07/02/16 240 PO Afebrile, VSS No labs this AM  Intake/Output from previous day: 07/10 0701 - 07/11 0700 In: 1240 [P.O.:240; I.V.:1000] Out: 0  Intake/Output this shift:    General appearance: alert, cooperative and no distress GI: soft, sore, sites all look fine.  Lab Results:   Recent Labs  07/02/16 1755  WBC 10.3  HGB 14.8  HCT 43.1  PLT 323    BMET  Recent Labs  07/02/16 1755 07/04/16 0448  NA 136 138  K 4.0 4.3  CL 105 106  CO2 24 28  GLUCOSE 93 135*  BUN 13 <5*  CREATININE 0.72 0.59  CALCIUM 9.2 8.9   PT/INR No results for input(s): LABPROT, INR in the last 72 hours.   Recent Labs Lab 07/02/16 1755 07/04/16 0448  AST 17 19  ALT 21 25  ALKPHOS 85 69  BILITOT 0.4 0.3  PROT 7.7 6.6  ALBUMIN 4.2 3.7     Lipase     Component Value Date/Time   LIPASE 32 07/02/2016 1755     Studies/Results: Dg Cholangiogram Operative  07/03/2016  CLINICAL DATA:  Cholelithiasis EXAM: INTRAOPERATIVE CHOLANGIOGRAM TECHNIQUE: Cholangiographic images from the C-arm fluoroscopic device were submitted for interpretation post-operatively. Please see the procedural report for the amount of contrast and the fluoroscopy time utilized. COMPARISON:  None. FINDINGS: Contrast fills the biliary tree and duodenum. There are multiple filling defects in the common bile duct. IMPRESSION: Findings are  worrisome for common bile duct stones. MRCP or ERCP can be performed to further delineate. Electronically Signed   By: Jolaine ClickArthur  Hoss M.D.   On: 07/03/2016 09:12   Dg Ercp With Sphincterotomy  07/04/2016  CLINICAL DATA:  Choledocholithiasis. EXAM: ERCP TECHNIQUE: Multiple spot images obtained with the fluoroscopic device and submitted for interpretation post-procedure. COMPARISON:  Intraoperative cholangiogram during laparoscopic cholecystectomy - 07/03/2016 FINDINGS: Nine spot intraoperative fluoroscopic images of the right upper abdominal quadrant during ERCP are provided for review. Initial image demonstrates an ERCP probe overlying the right upper abdominal quadrant. There is selective cannulation opacification of the CBD which appears mildly dilated. Surgical clips overlie the expected location of the gallbladder fossa. There is no significant opacification of the cystic duct. There is minimal opacification the central aspect of the intrahepatic biliary tree which appears nondilated. No discrete persistent filling defects in the opacified portions of the biliary tree to suggest choledocholithiasis. Subsequent images demonstrate insufflation of a balloon within the distal aspect of the CBD with subsequent presumed biliary sweeping and sphincterotomy. IMPRESSION: ERCP with biliary sweeping and presumed sphincterotomy as above. These images were submitted for radiologic interpretation only. Please see the procedural report for the amount of contrast and the fluoroscopy time utilized. Electronically Signed   By: Simonne ComeJohn  Watts M.D.   On: 07/04/2016 14:24    Medications: . cefTRIAXone (  ROCEPHIN)  IV  2 g Intravenous QHS  . indomethacin  100 mg Rectal 30 min Pre-Op  . nicotine  14 mg Transdermal Daily   . dextrose 5 % and 0.45 % NaCl with KCl 20 mEq/L 100 mL/hr at 07/05/16 0142    Assessment/Plan Gallstone cholecystitis/choledocholithiasis S/p laparoscopic cholecystectomy with IOC 07/03/16, Dr. Ovidio Kin ERCP/spinchterotomy, 07/04/16, Dr. Myrtie Neither III Anxiety  Body mass index is 44.3  Tobacco use Poor dentition FEN: low fat diet/IV fluids ID: day 4 Rocephin   DVT:  SCD   Plan:  Advance diet, ambulate her and get her moving.  Add Ibuprofen to pain meds.  Plan to send her home later this AM.      LOS: 3 days    Kasheem Toner 07/05/2016 [phone]

## 2016-07-05 NOTE — Progress Notes (Signed)
07/05/16  1400  Reviewed discharge instructions with patient.  Patient verbalized understanding of discharge instructions. Copy of discharge instructions, prescription, and work note given to patient.

## 2016-07-06 NOTE — Discharge Summary (Signed)
Physician Discharge Summary  Patient ID: Kelli Holland MRN: [id number] DOB/AGE: [birth year] 25 y.o.  Admit date: 07/02/2016 Discharge date: 07/05/2016  Admission Diagnoses:  Cholecystitis  Tobacco use Poor dentition  Discharge Diagnoses:    Active Problems:   Cholecystitis   Choledocholithiasis   PROCEDURES:  ERCP/spinchterotomy 07/04/16, Dr. Amada JupiterHenry Danis Holland Laparoscopic cholecystectomy with IOC 07/03/16, Dr. Marty Holland  Hospital Course:  Kelli Holland is a 25 y.o. (DOB: [birth year]) white female whose primary care physician is Kelli Lan[place]enny Jones, [place], [place], and [facility]  [facility] for abdominal pain. Her boyfried, Kelli Holland, is in Kelli room with her. She has had abdominal pain and diarrhea for about 10 days. Kelli abdominal pain has become significantly worse over Kelli last day. She has not been jaundiced. She has knows gallstones. But when she found out about Kelli gall stones last year, she did not have insurance. Kelli abdominal pain is in her RUQ and towards her back. US of abdomen - 07/02/2016 - gall packed with stones (US in 10/16/2015 showed multiple gall stones) WBC - 10,600 - 07/02/2016 She was admitted by Dr. Ezzard Holland and taken to Kelli OR Kelli following AM.  IOC showed CBD stone and she was then [facility] and set up for ERCP Kelli following day.  Post procedures she made good progress and was ready for discharge Kelli first post op morning after her ERCP.    CBC Latest Ref Rng 07/02/2016 10/15/2015 07/04/2013  WBC 4.0 - 10.5 K/uL 10.3 10.6(H) 11.9(H)  Hemoglobin 12.0 - 15.0 g/dL 13.214.8 44.013.6 10.213.6  Hematocrit 36.0 - 46.0 % 43.1 40.7 40.9  Platelets 150 - 400 K/uL 323 317 346   CMP Latest Ref Rng 07/04/2016 07/02/2016 10/15/2015  Glucose 65 - 99 mg/dL 725(D135(H) 93 99  BUN 6 - 20 mg/dL <6(U<5(L) 13 11  Creatinine 0.44 - 1.00 mg/dL 4.400.59 3.470.72 4.250.67  Sodium 135 - 145 mmol/L 138 136 139  Potassium 3.5 - 5.1 mmol/L 4.3 4.0 3.8  Chloride 101 - 111 mmol/L 106 105 107  CO2 22 - 32 mmol/L 28 24 25    Calcium 8.9 - 10.3 mg/dL 8.9 9.2 9.2  Total Protein 6.5 - 8.1 g/dL 6.6 7.7 6.9  Total Bilirubin 0.3 - 1.2 mg/dL 0.3 0.4 0.5  Alkaline Phos 38 - 126 U/L 69 85 82  AST 15 - 41 U/L 19 17 20   ALT 14 - 54 U/L 25 21 22    Condition on d/c:  Improved      Disposition: 01-Home or Self Care     Medication List    STOP taking these medications        bismuth subsalicylate 262 MG chewable tablet  Commonly known as:  PEPTO BISMOL     calcium carbonate 500 MG chewable tablet  Commonly known as:  TUMS - dosed in mg elemental calcium     ibuprofen 200 MG tablet  Commonly known as:  ADVIL,MOTRIN      TAKE these medications        acetaminophen 325 MG tablet  Commonly known as:  TYLENOL  Your can take this every 4 hours, and if no relief you can use Kelli prescription pain medicine.  Do not take more than 4000 mg of acetominophen per day.     cetirizine 10 MG tablet  Commonly known as:  ZYRTEC  Take 10 mg by mouth daily as needed for allergies.     diphenhydrAMINE 25 MG tablet  Commonly known as:  BENADRYL  Take 2 tablets (50 mg total) by mouth every 4 (four) hours as needed for itching or allergies.     EPINEPHrine 0.3 mg/0.3 mL Soaj injection  Commonly known as:  EPIPEN  Inject 0.3 mLs (0.3 mg total) into Kelli muscle as needed.     levonorgestrel 20 MCG/24HR IUD  Commonly known as:  MIRENA  1 each by Intrauterine route once.     oxyCODONE-acetaminophen 5-325 MG tablet  Commonly known as:  PERCOCET/ROXICET  Take 1-2 tablets by mouth every 3 (three) hours as needed for moderate pain.     pseudoephedrine 120 MG 12 hr tablet  Commonly known as:  SUDAFED  Take 120 mg by mouth 2 (two) times daily as needed for congestion.     ranitidine 150 MG tablet  Commonly known as:  ZANTAC  Take 150 mg by mouth 2 (two) times daily as needed for heartburn.           Follow-up Information    Follow up with [facility] On 07/27/2016.   Specialty:  General Surgery   Why:   Your appointment is at 9:15, be at Kelli office 30 minutes early for check in.     Contact information:   [address] [place] [address] [phone]       Signed: Sherrie George 07/06/2016, 12:38 PM

## 2017-02-27 IMAGING — US US ABDOMEN LIMITED
1 series · 14 of 25 positions shown · non-contrast
Comparison: None.

CLINICAL DATA: Acute onset of epigastric abdominal pain and nausea
for 1 week. Initial encounter.

EXAM:
US ABDOMEN LIMITED - RIGHT UPPER QUADRANT

[Series 1: us abdomen limited · 0.28mm/px · 14 of 35 slices shown]
[im 1/35]
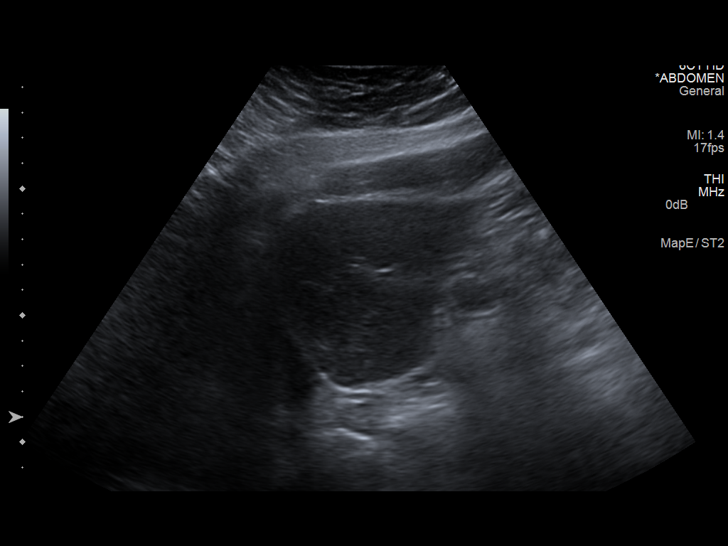
[im 3/35]
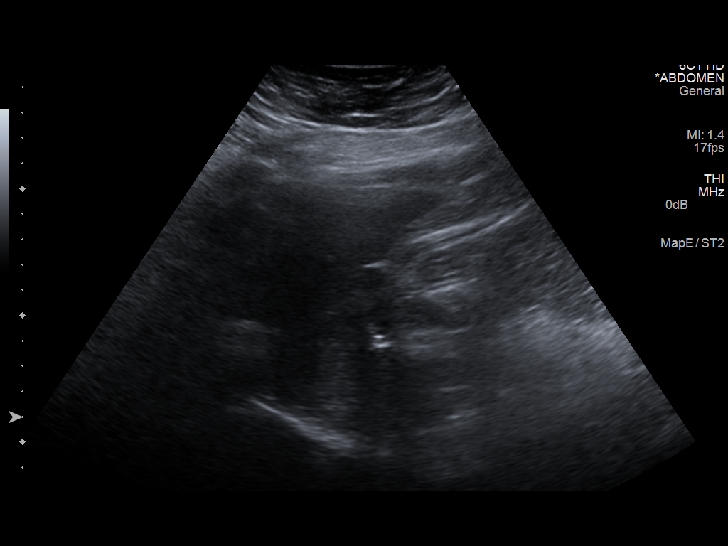
[im 6/35]
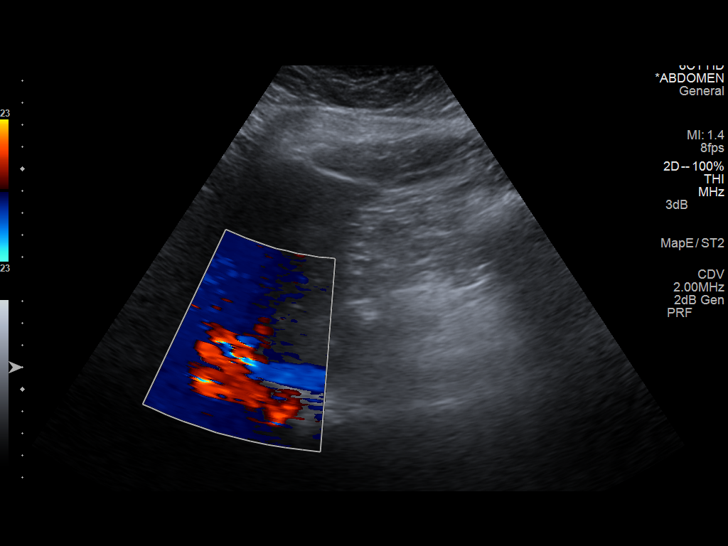
[im 9/35]
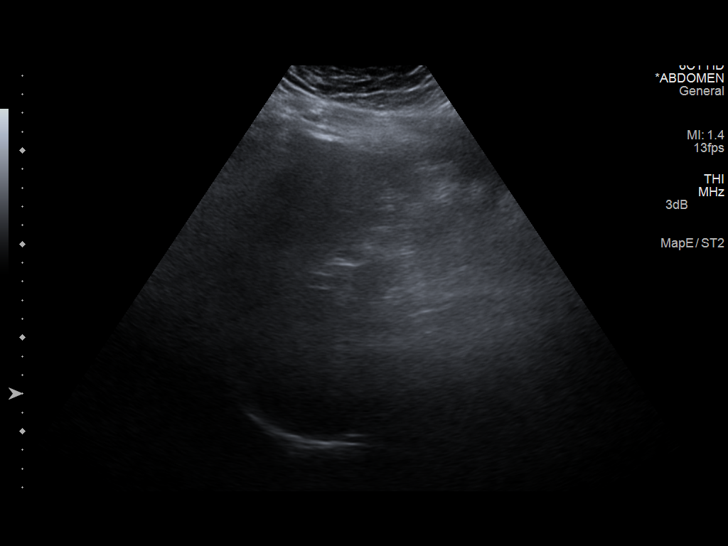
[im 12/35]
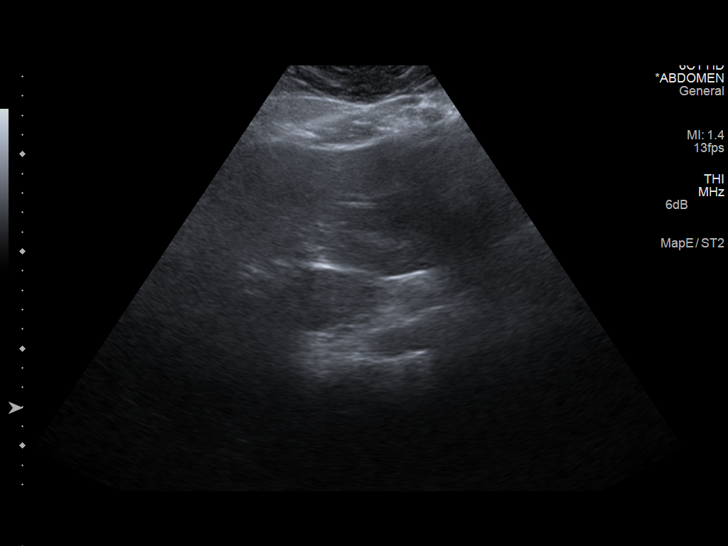
[im 13/35]
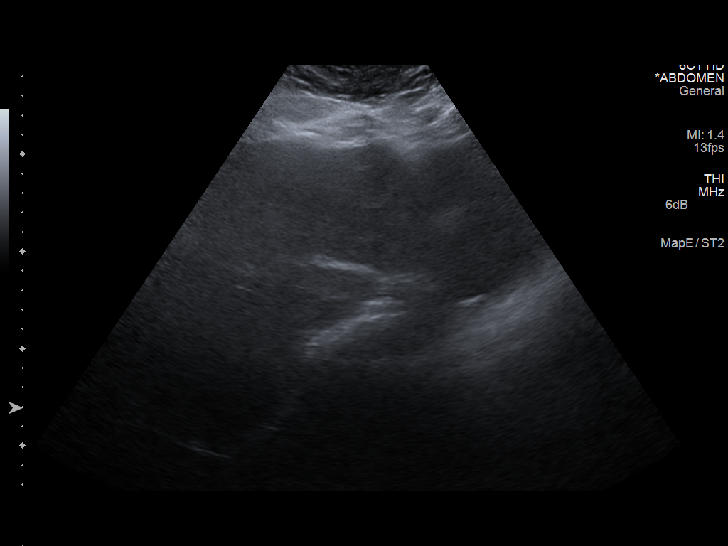
[im 16/35]
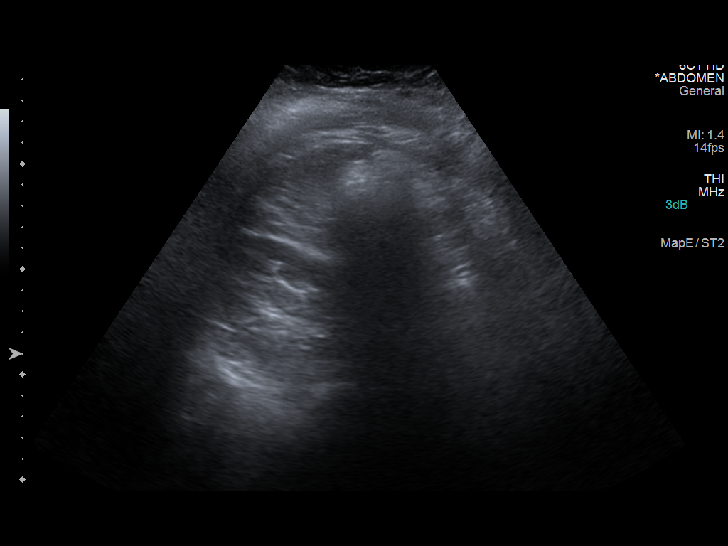
[im 19/35]
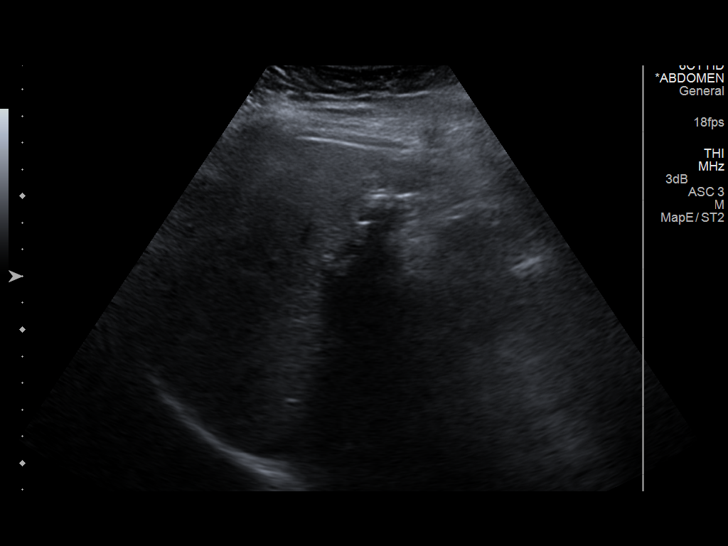
[im 22/35]
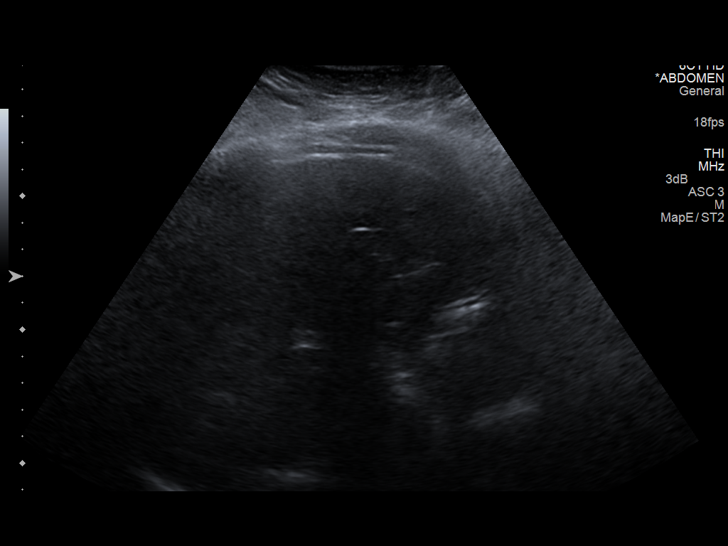
[im 23/35]
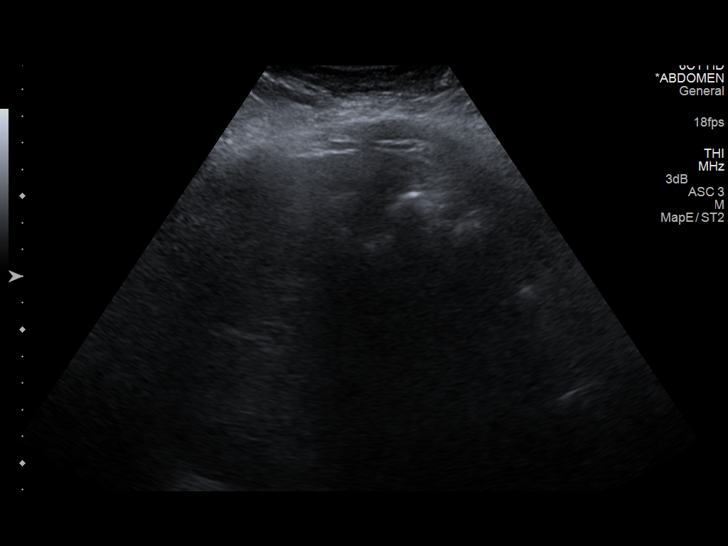
[im 26/35]
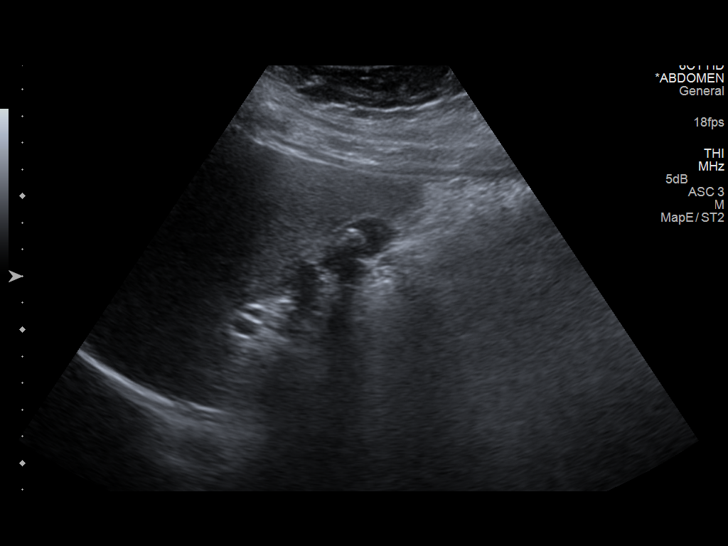
[im 29/35]
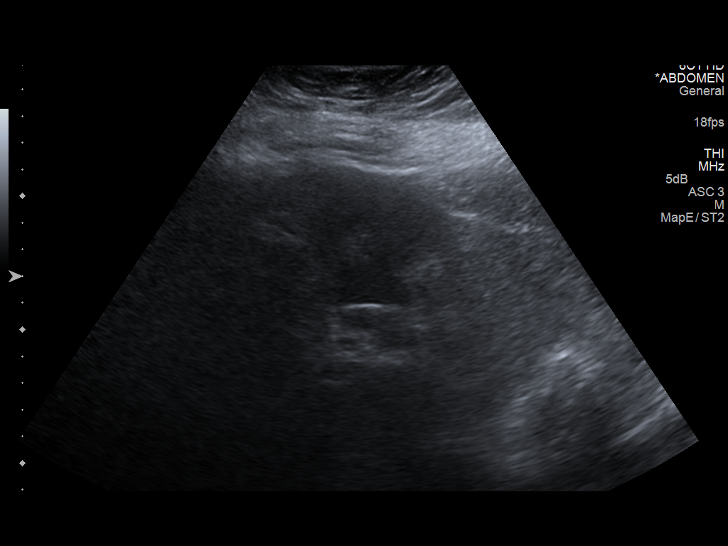
[im 32/35]
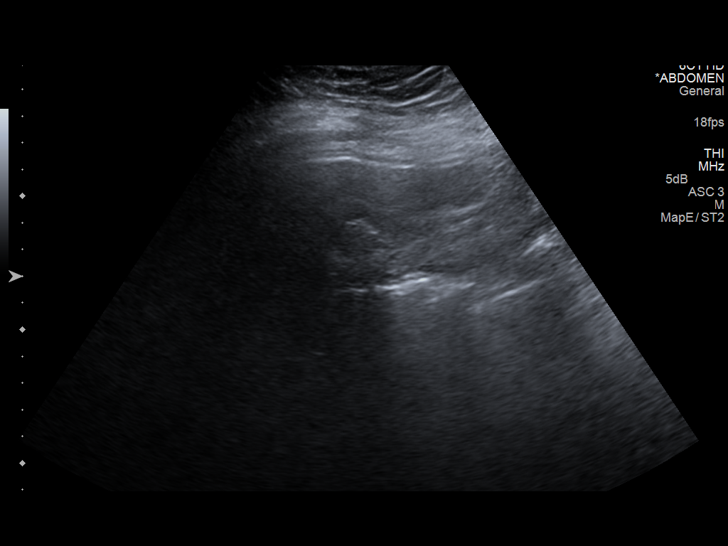
[im 35/35]
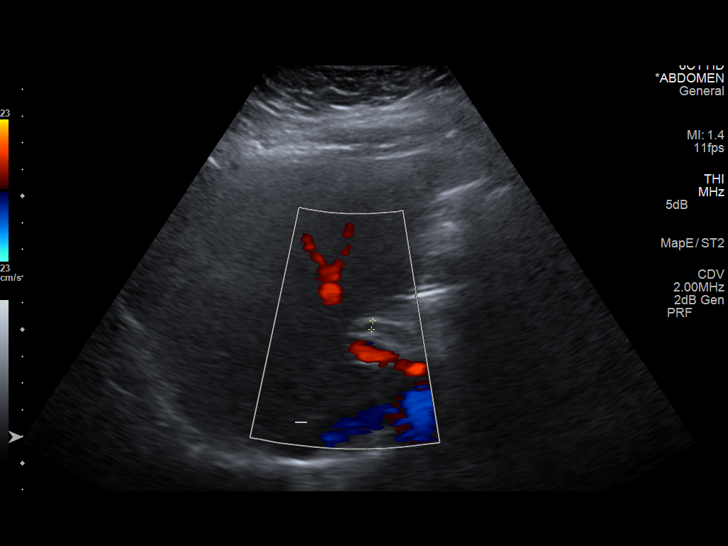

[14 of 25 positions shown; findings below may reference images not displayed]

FINDINGS: Gallbladder:

Multiple stones are seen within the gallbladder. The gallbladder is
partially decompressed and otherwise unremarkable. No gallbladder
wall thickening or pericholecystic fluid is seen. No
ultrasonographic Murphy's sign is elicited.

Common bile duct:

Diameter: 0.3 cm, within normal limits in caliber

Liver:

No focal lesion identified. Within normal limits in parenchymal
echogenicity.
IMPRESSION: Cholelithiasis. Gallbladder otherwise unremarkable in appearance. No
evidence for obstruction or cholecystitis.

## 2017-11-14 IMAGING — US US ABDOMEN LIMITED
1 series · 14 of 25 positions shown · non-contrast
Comparison: Right upper quadrant ultrasound 10/16/2015

CLINICAL DATA: Right upper quadrant pain.  History of gallstones.

EXAM:
US ABDOMEN LIMITED - RIGHT UPPER QUADRANT

[Series 1: us abdomen limited · 0.22mm/px · 14 of 165 slices shown]
[im 1/165]
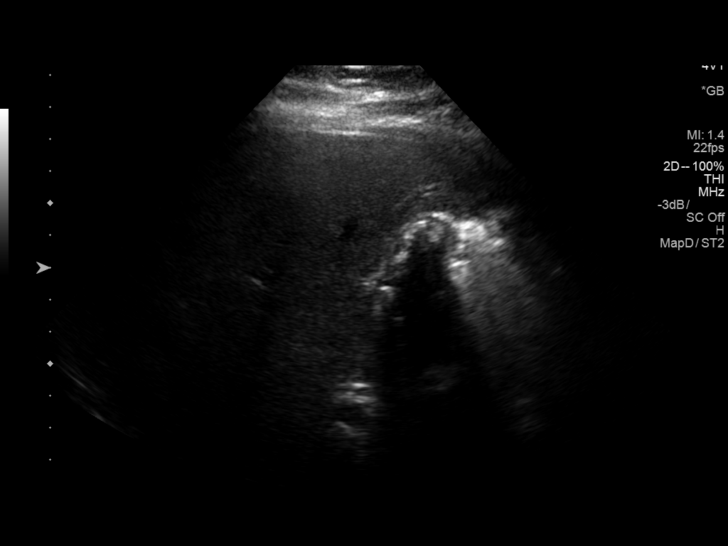
[im 14/165]
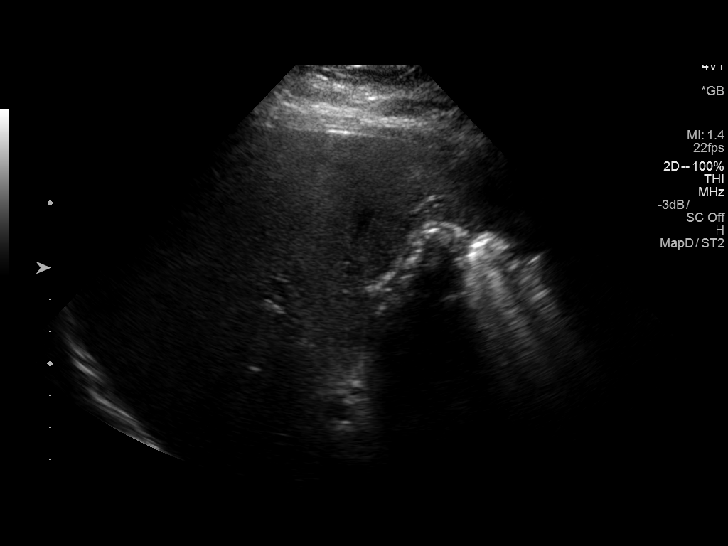
[im 28/165]
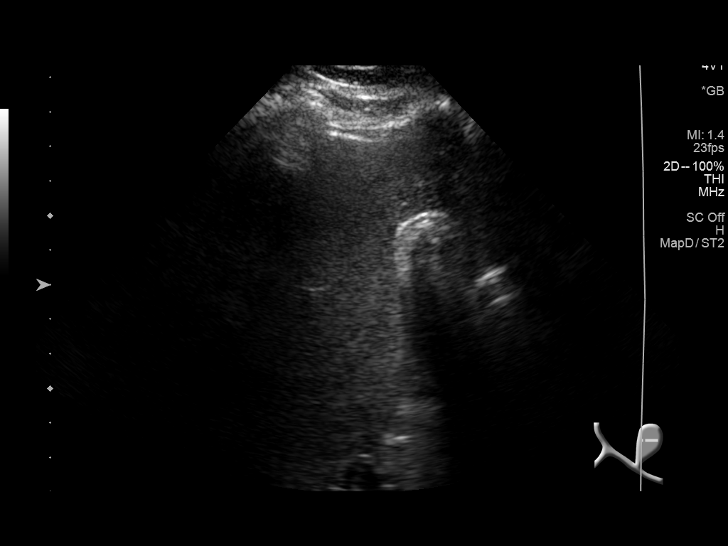
[im 42/165]
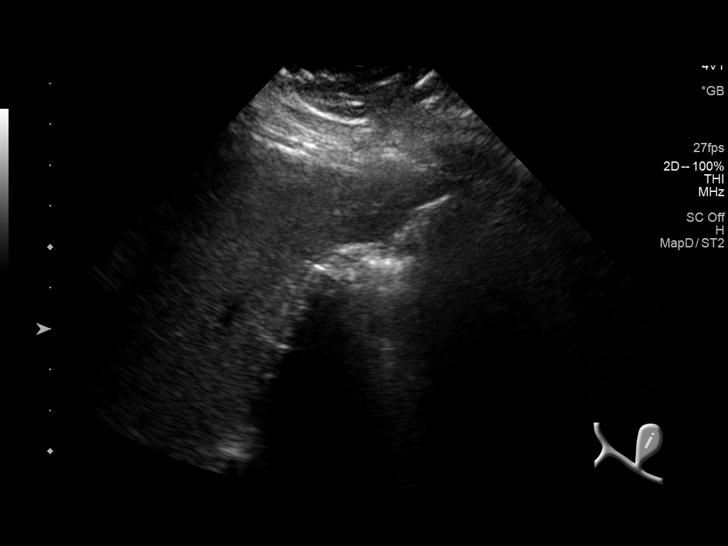
[im 55/165]
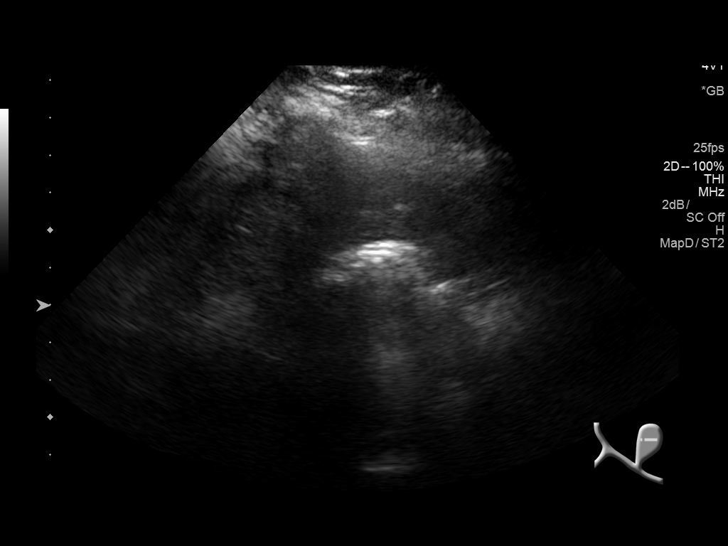
[im 62/165]
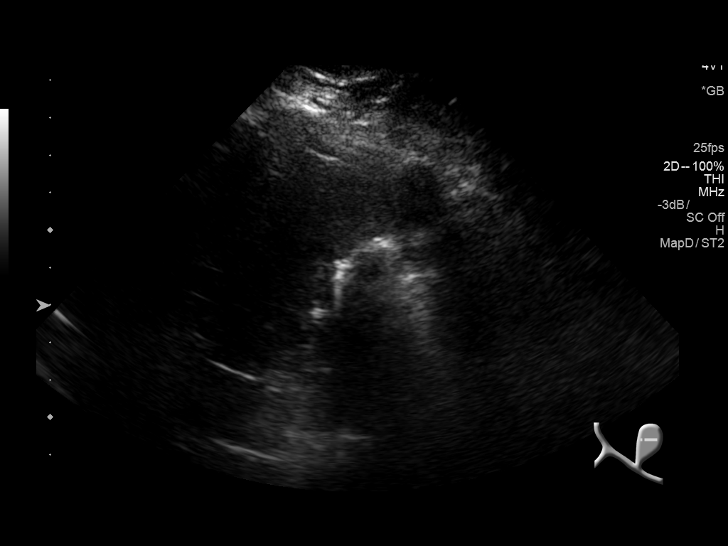
[im 76/165]
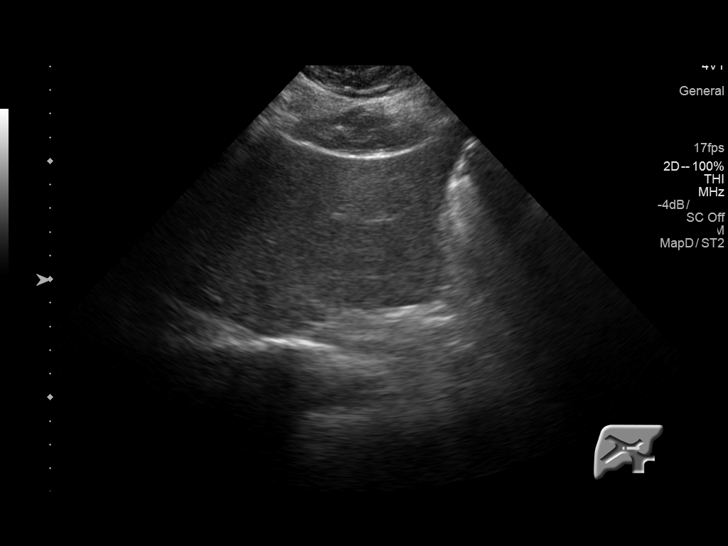
[im 89/165]
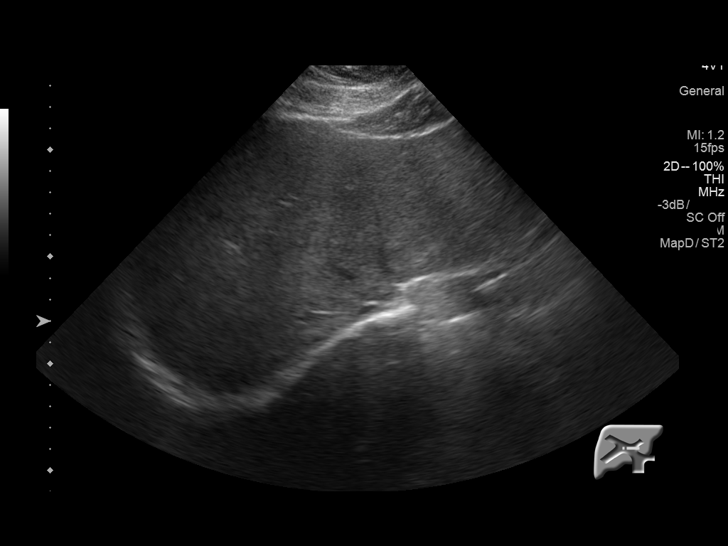
[im 103/165]
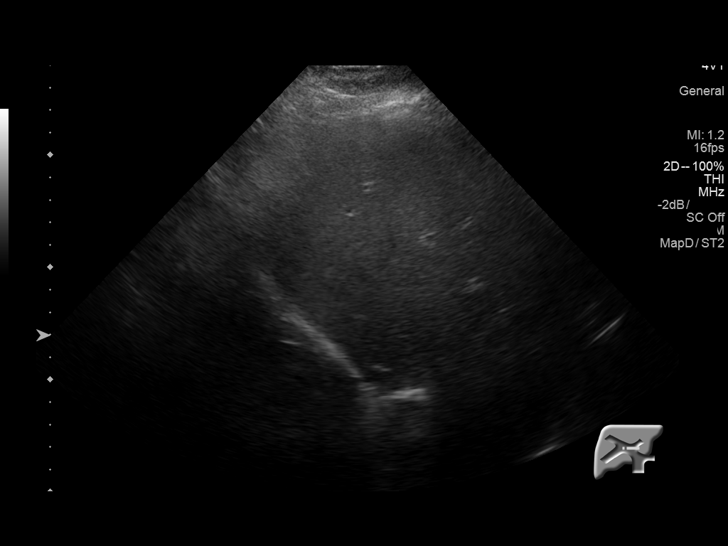
[im 110/165]
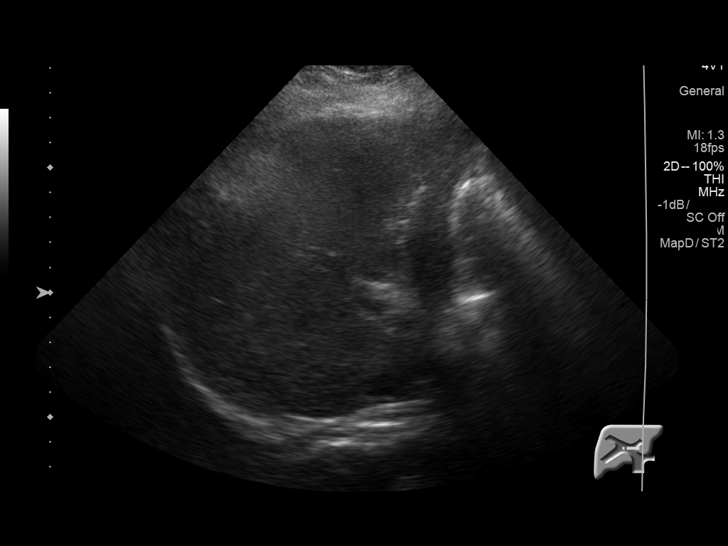
[im 124/165]
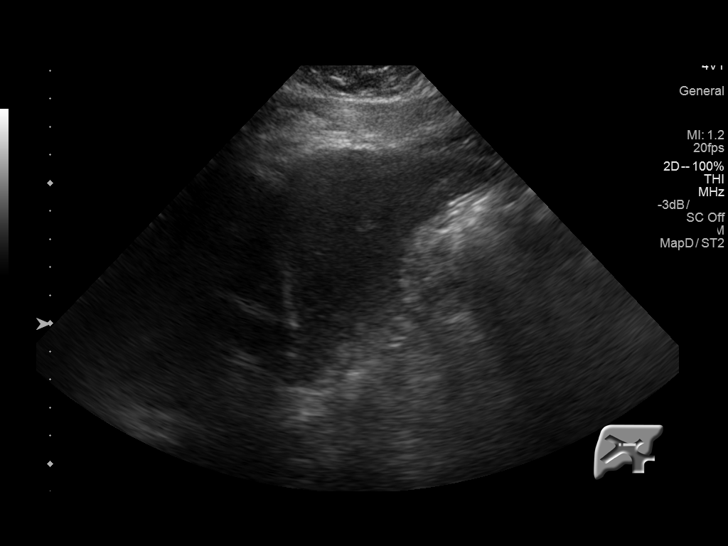
[im 137/165]
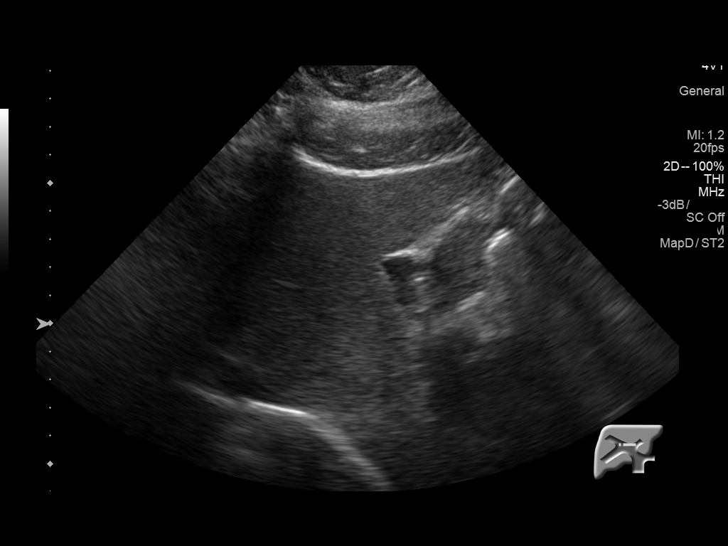
[im 151/165]
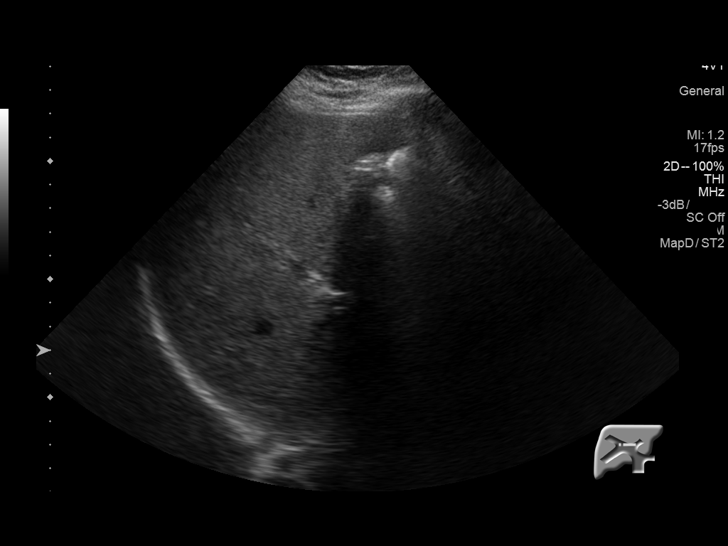
[im 165/165]
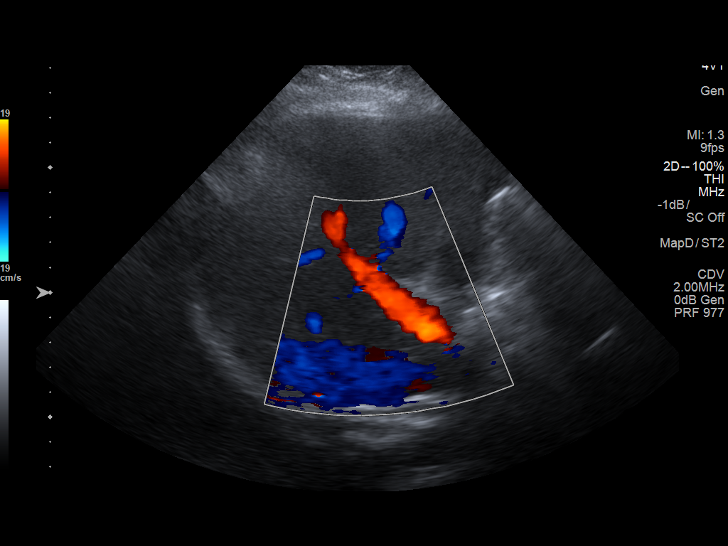

[14 of 25 positions shown; findings below may reference images not displayed]

FINDINGS: Gallbladder:

Gallbladder filled with shadowing stones creating a wall echo shadow
complex. Normal wall thickness where visualized measuring 1.6 mm. No
pericholecystic fluid. No sonographic Murphy sign noted by
sonographer.

Common bile duct:

Diameter: 3.1 mm

Liver:

No focal lesion identified. Heterogeneous in parenchymal
echogenicity, increased compared to right kidney. Normal directional
flow in the main portal vein.
IMPRESSION: 1. Cholelithiasis without gallbladder wall thickening or findings of
acute cholecystitis. No biliary dilatation.
2. Borderline mild hepatic steatosis.

## 2018-01-10 IMAGING — RF DG CHOLANGIOGRAM OPERATIVE
1 series · 13 of 13 positions shown · non-contrast
Comparison: None.

CLINICAL DATA: Cholelithiasis

EXAM:
INTRAOPERATIVE CHOLANGIOGRAM
TECHNIQUE: Cholangiographic images from the C-arm fluoroscopic device were
submitted for interpretation post-operatively. Please see the
procedural report for the amount of contrast and the fluoroscopy
time utilized.

[Series 1: run · 4 acquisitions, 13 frames shown]
[im 1/4]
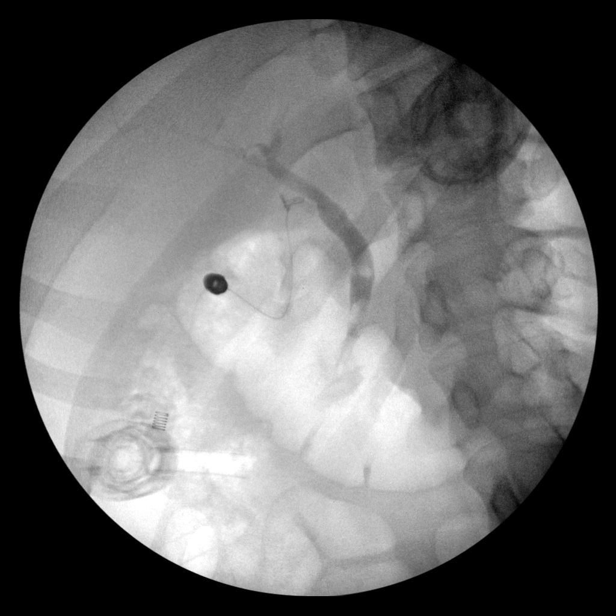
[im 1/4]
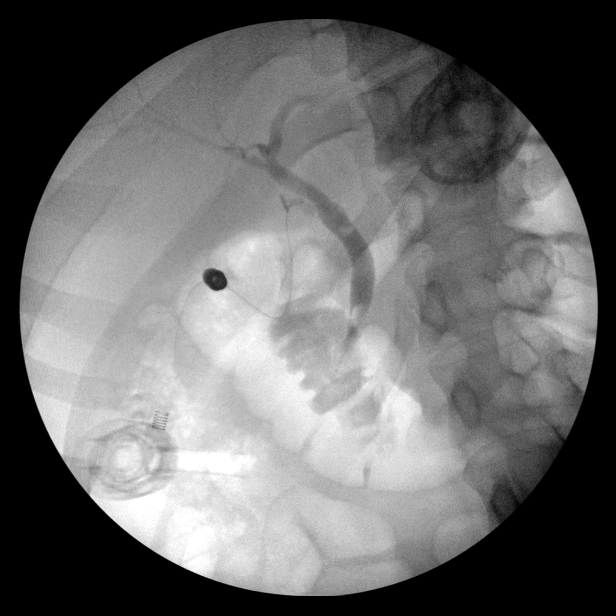
[im 1/4]
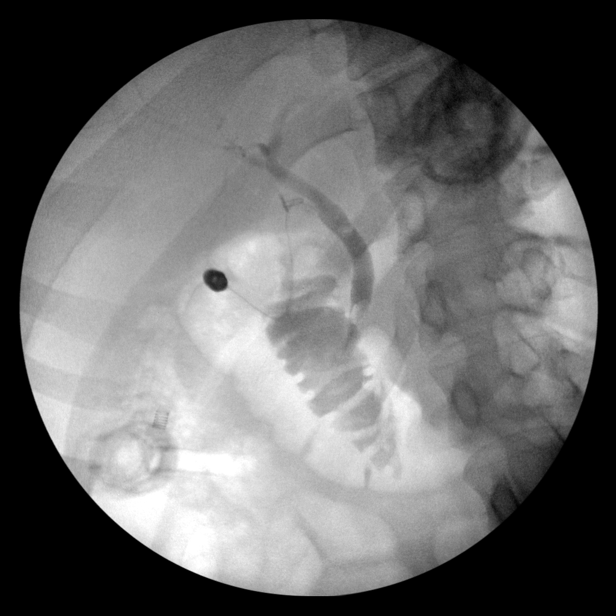
[im 1/4]
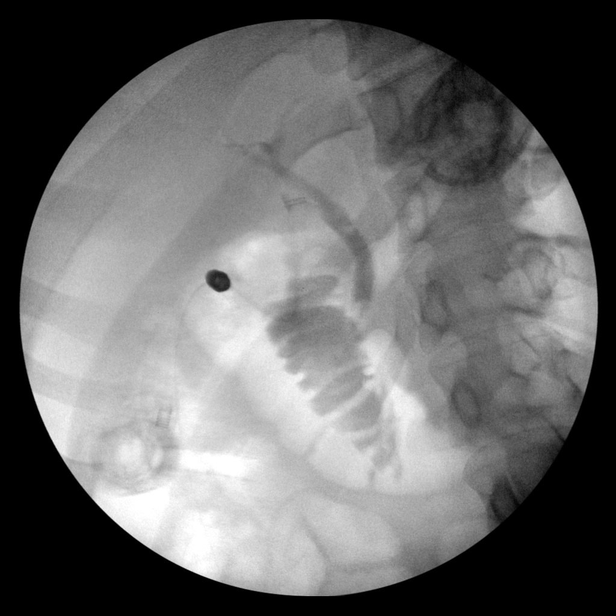
[im 2/4]
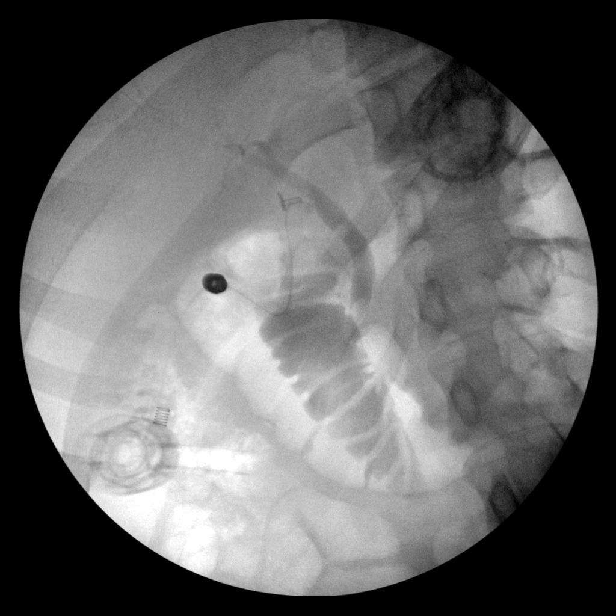
[im 2/4]
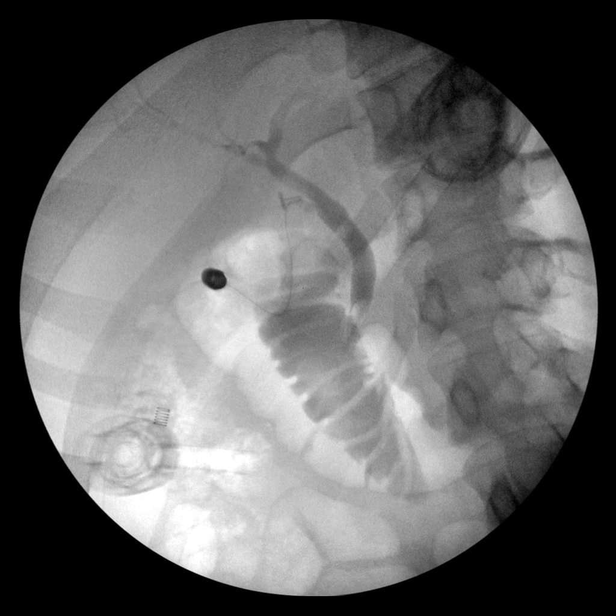
[im 2/4]
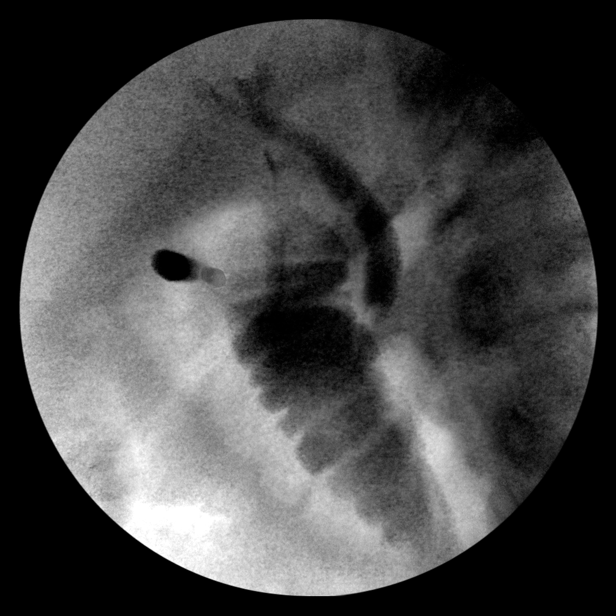
[im 2/4]
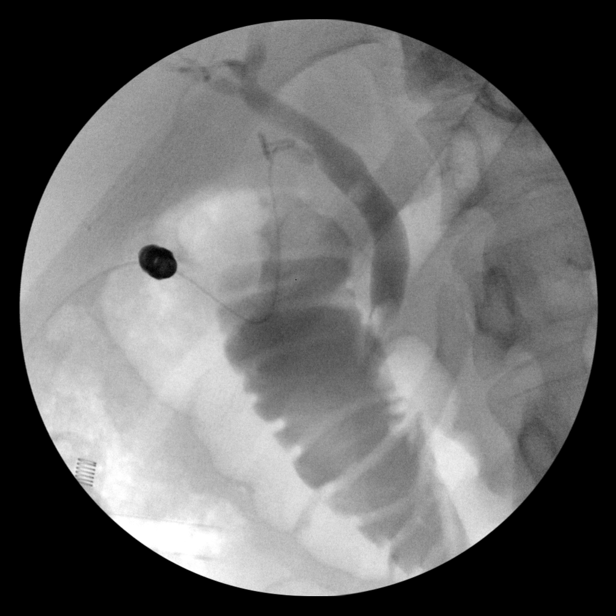
[im 3/4]
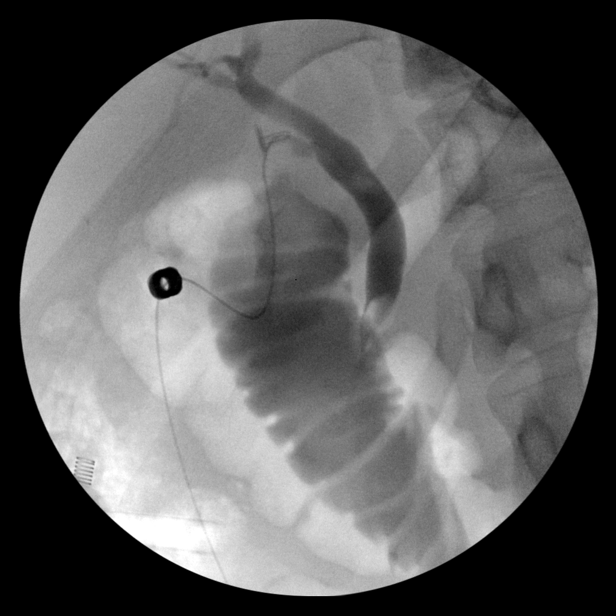
[im 3/4]
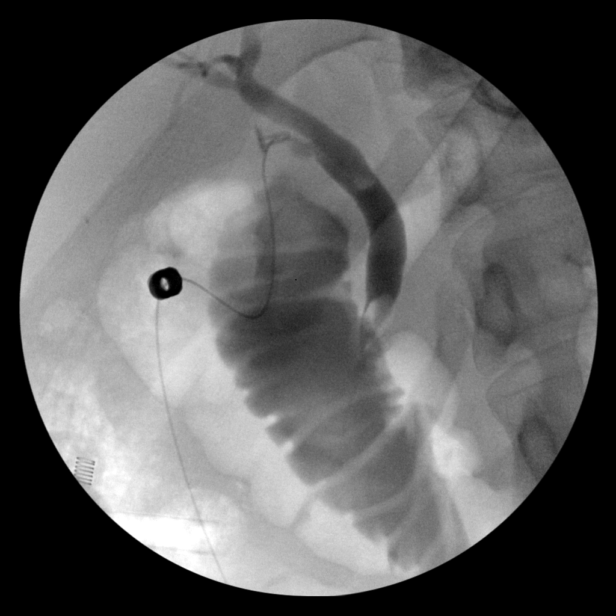
[im 3/4]
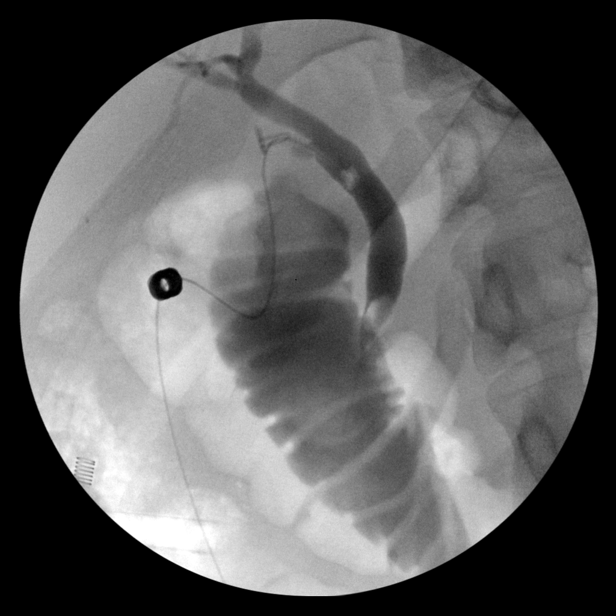
[im 3/4]
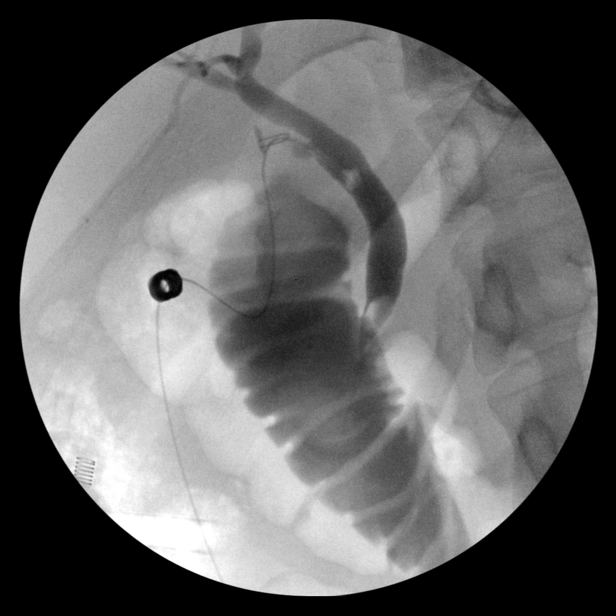
[im 4/4]
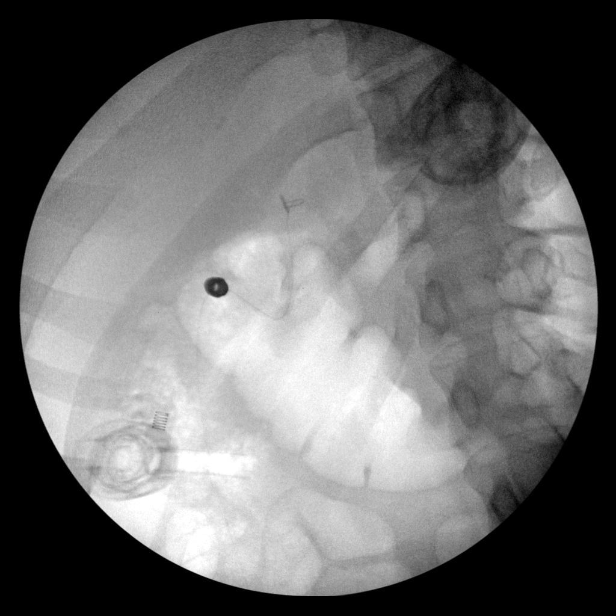

[13 of 13 positions shown; findings below may reference images not displayed]

FINDINGS: Contrast fills the biliary tree and duodenum. There are multiple
filling defects in the common bile duct.
IMPRESSION: Findings are worrisome for common bile duct stones. MRCP or ERCP can
be performed to further delineate.

## 2018-01-11 IMAGING — RF DG ERCP WO/W SPHINCTEROTOMY
1 series · 9 of 9 positions shown · non-contrast
Comparison: Intraoperative cholangiogram during laparoscopic
cholecystectomy - 07/03/2016

CLINICAL DATA: Choledocholithiasis.

EXAM:
ERCP
TECHNIQUE: Multiple spot images obtained with the fluoroscopic device and
submitted for interpretation post-procedure.

[Series 1: run · 9 of 9 slices shown]
[im 1/9]
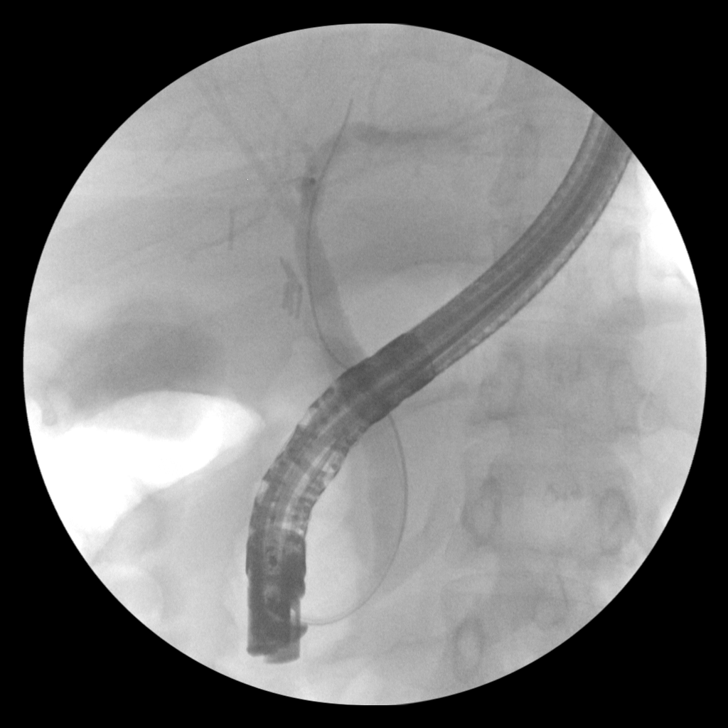
[im 2/9]
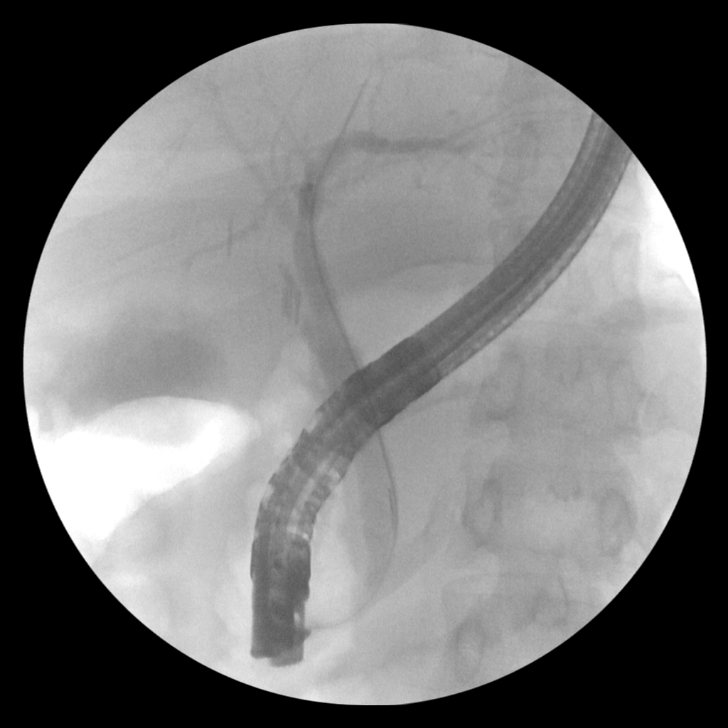
[im 3/9]
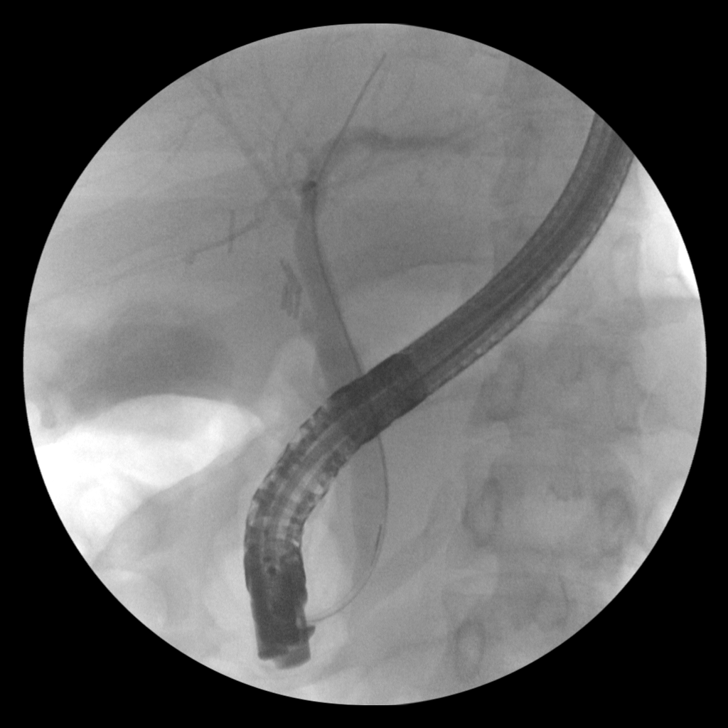
[im 4/9]
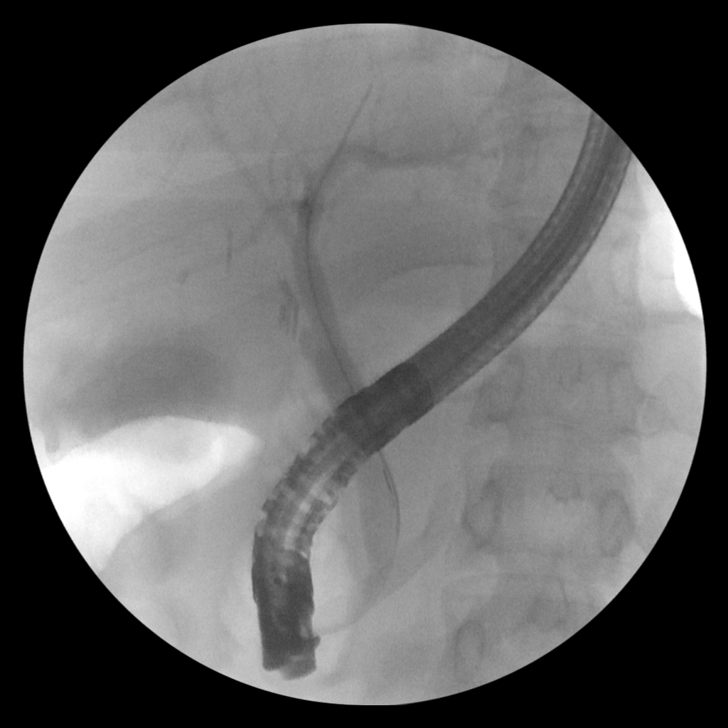
[im 5/9]
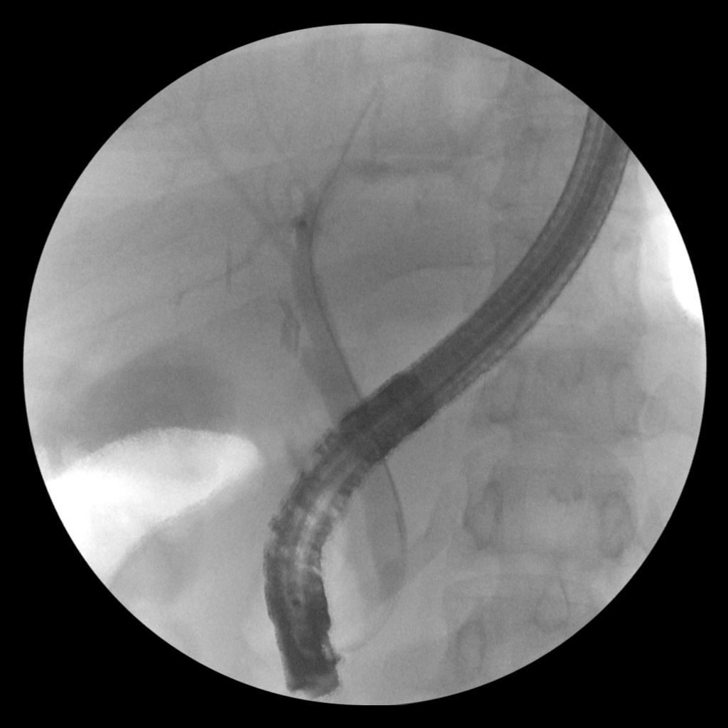
[im 6/9]
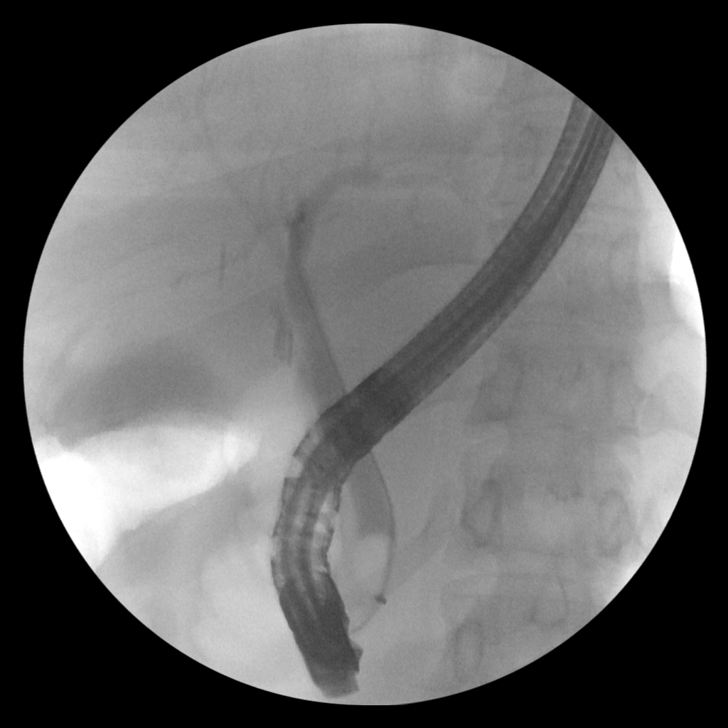
[im 7/9]
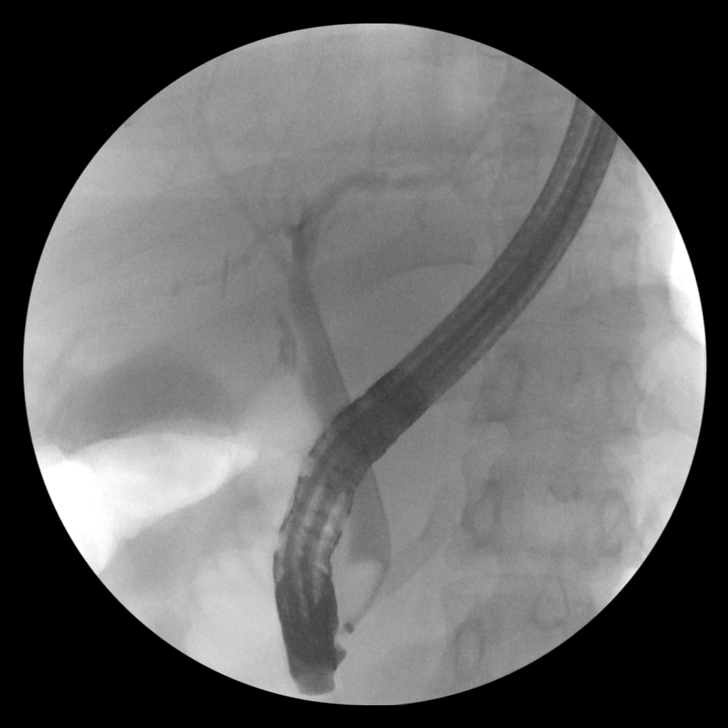
[im 8/9]
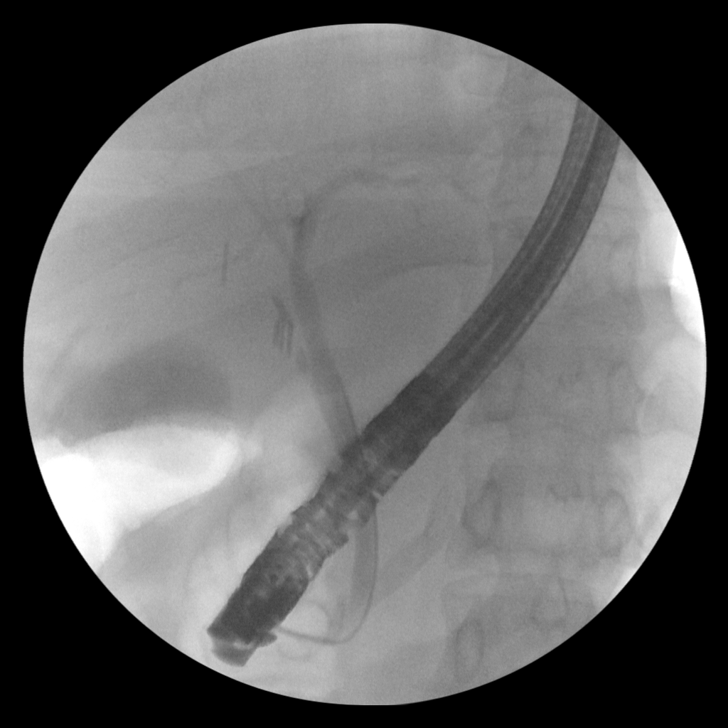
[im 9/9]
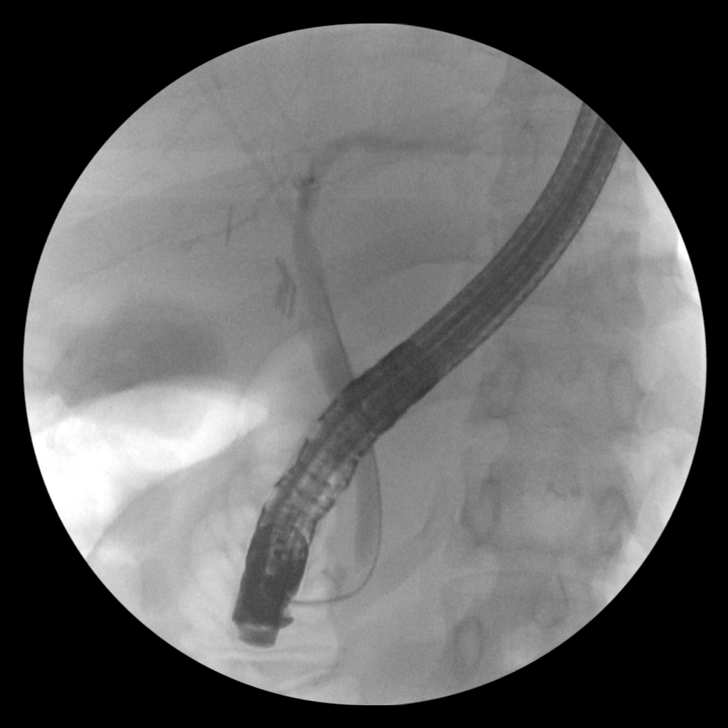

[9 of 9 positions shown; findings below may reference images not displayed]

FINDINGS: Nine spot intraoperative fluoroscopic images of the right upper
abdominal quadrant during ERCP are provided for review.

Initial image demonstrates an ERCP probe overlying the right upper
abdominal quadrant. There is selective cannulation opacification of
the CBD which appears mildly dilated. Surgical clips overlie the
expected location of the gallbladder fossa.

There is no significant opacification of the cystic duct. There is
minimal opacification the central aspect of the intrahepatic biliary
tree which appears nondilated.

No discrete persistent filling defects in the opacified portions of
the biliary tree to suggest choledocholithiasis.

Subsequent images demonstrate insufflation of a balloon within the
distal aspect of the CBD with subsequent presumed biliary sweeping
and sphincterotomy.
IMPRESSION: ERCP with biliary sweeping and presumed sphincterotomy as above.

These images were submitted for radiologic interpretation only.
Please see the procedural report for the amount of contrast and the
fluoroscopy time utilized.
